# Patient Record
Sex: Female | Born: 1940 | Race: White | Hispanic: No | Smoking: Never smoker
Health system: Southern US, Community
[De-identification: ages and names within clinical notes are randomized; demographics above are authoritative.]

## PROBLEM LIST (undated history)

## (undated) DIAGNOSIS — K7469 Other cirrhosis of liver: Principal | ICD-10-CM

## (undated) DIAGNOSIS — Z8711 Personal history of peptic ulcer disease: Secondary | ICD-10-CM

## (undated) DIAGNOSIS — G459 Transient cerebral ischemic attack, unspecified: Secondary | ICD-10-CM

## (undated) DIAGNOSIS — K769 Liver disease, unspecified: Secondary | ICD-10-CM

## (undated) DIAGNOSIS — I10 Essential (primary) hypertension: Secondary | ICD-10-CM

## (undated) DIAGNOSIS — H8109 Meniere's disease, unspecified ear: Secondary | ICD-10-CM

## (undated) DIAGNOSIS — E785 Hyperlipidemia, unspecified: Secondary | ICD-10-CM

## (undated) DIAGNOSIS — M1990 Unspecified osteoarthritis, unspecified site: Secondary | ICD-10-CM

## (undated) DIAGNOSIS — K279 Peptic ulcer, site unspecified, unspecified as acute or chronic, without hemorrhage or perforation: Secondary | ICD-10-CM

## (undated) DIAGNOSIS — R42 Dizziness and giddiness: Secondary | ICD-10-CM

## (undated) DIAGNOSIS — K2270 Barrett's esophagus without dysplasia: Secondary | ICD-10-CM

## (undated) DIAGNOSIS — N183 Chronic kidney disease, stage 3 unspecified: Secondary | ICD-10-CM

## (undated) DIAGNOSIS — F32A Depression, unspecified: Secondary | ICD-10-CM

## (undated) DIAGNOSIS — F419 Anxiety disorder, unspecified: Secondary | ICD-10-CM

## (undated) DIAGNOSIS — Z8781 Personal history of (healed) traumatic fracture: Secondary | ICD-10-CM

## (undated) DIAGNOSIS — R269 Unspecified abnormalities of gait and mobility: Secondary | ICD-10-CM

## (undated) DIAGNOSIS — G4700 Insomnia, unspecified: Secondary | ICD-10-CM

## (undated) DIAGNOSIS — G250 Essential tremor: Secondary | ICD-10-CM

## (undated) DIAGNOSIS — K219 Gastro-esophageal reflux disease without esophagitis: Secondary | ICD-10-CM

## (undated) DIAGNOSIS — I700 Atherosclerosis of aorta: Secondary | ICD-10-CM

## (undated) HISTORY — DX: Dizziness and giddiness: R42

## (undated) HISTORY — PX: CHOLECYSTECTOMY: SHX55

## (undated) HISTORY — DX: Peptic ulcer, site unspecified, unspecified as acute or chronic, without hemorrhage or perforation: K27.9

## (undated) HISTORY — DX: Unspecified abnormalities of gait and mobility: R26.9

## (undated) HISTORY — DX: Transient cerebral ischemic attack, unspecified: G45.9

## (undated) HISTORY — DX: Unspecified osteoarthritis, unspecified site: M19.90

## (undated) HISTORY — DX: Hyperlipidemia, unspecified: E78.5

## (undated) HISTORY — DX: Liver disease, unspecified: K76.9

## (undated) HISTORY — PX: CATARACT EXTRACTION: SUR2

## (undated) HISTORY — DX: Barrett's esophagus without dysplasia: K22.70

## (undated) HISTORY — DX: Depression, unspecified: F32.A

## (undated) HISTORY — DX: Chronic kidney disease, stage 3 unspecified: N18.30

## (undated) HISTORY — DX: Essential tremor: G25.0

## (undated) HISTORY — DX: Personal history of (healed) traumatic fracture: Z87.81

## (undated) HISTORY — DX: Insomnia, unspecified: G47.00

## (undated) HISTORY — DX: Personal history of peptic ulcer disease: Z87.11

## (undated) HISTORY — PX: REPLACEMENT TOTAL KNEE BILATERAL: SUR1225

## (undated) HISTORY — DX: Essential (primary) hypertension: I10

## (undated) HISTORY — DX: Gastro-esophageal reflux disease without esophagitis: K21.9

## (undated) HISTORY — DX: Atherosclerosis of aorta: I70.0

## (undated) HISTORY — DX: Meniere's disease, unspecified ear: H81.09

## (undated) HISTORY — DX: Anxiety disorder, unspecified: F41.9

---

## 2003-10-27 DIAGNOSIS — K284 Chronic or unspecified gastrojejunal ulcer with hemorrhage: Secondary | ICD-10-CM

## 2003-10-27 HISTORY — DX: Chronic or unspecified gastrojejunal ulcer with hemorrhage: K28.4

## 2015-04-04 ENCOUNTER — Ambulatory Visit: Admit: 2015-04-04 | Discharge: 2015-04-04 | Payer: MEDICARE | Attending: Surgery | Primary: Internal Medicine

## 2015-04-04 DIAGNOSIS — G250 Essential tremor: Secondary | ICD-10-CM

## 2015-04-04 DIAGNOSIS — K828 Other specified diseases of gallbladder: Secondary | ICD-10-CM | POA: Insufficient documentation

## 2015-04-04 MED ORDER — DICYCLOMINE HCL 20 MG PO TABS
20 MG | ORAL_TABLET | Freq: Three times a day (TID) | ORAL | Status: AC | PRN
Start: 2015-04-04 — End: ?

## 2015-04-04 NOTE — Progress Notes (Signed)
Advanced Laparoscopy  New Patient Intake  Date: 04/04/15  Patient Name: Cindy Bernard    HPI:  Cindy Bernard is a 74 y.o. 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   First occurrence was 1 month ago.  She has had another attack of pain since after eating a buttered english muffin.  She underwent US by Dr. Rock Nephew her PCP showing sludge with normal CBD, no evidence of pericholecystic fluid or ascites.  She then underwent a HIDA scan showing and EF of 11%.  She did not have symptoms elicited with the HIDA scan.  She states she does drink daily and does follow with Dr. Rock Nephew every three months for her liver and has regular laboratory testing.  She denies kidney problems, bleeding, yellowing of the skin or eyes, abdominal bloating and pain other than the new RUQ pain.  She has had a colonoscopy in the last 5 years.  She denies changes in her bowel habits.  She also states she recently had UGI bleeding she was told was secondary to a stomach ulcer and she had to undergo and interventional procedure and EGD to stop the bleeding.  She did also ask about liver biopsy for further workup of there liver disease.     PMHx:   Past Medical History   Diagnosis Date   ??? Bronchitis    ??? Hypertension    ??? Osteoarthritis    ??? Peptic ulcer disease    ??? Cirrhosis with alcoholism [facility])        PSHx:   Past Surgical History   Procedure Laterality Date   ??? Knee surgery  2005     [place]   ??? Colonoscopy     ??? Hysterectomy     ??? Upper gastrointestinal endoscopy         PFMHx: History reviewed. No pertinent family history.    ALL: No Known Allergies    MEDS:   Current Outpatient Prescriptions   Medication Sig Dispense Refill   ??? lisinopril (PRINIVIL;ZESTRIL) 40 MG tablet Take 40 mg by mouth daily     ??? omeprazole (PRILOSEC) 20 MG capsule Take 20 mg by mouth daily     ??? ALPRAZolam (XANAX) 0.25 MG  tablet Take 0.25 mg by mouth nightly as needed for Sleep     ??? amLODIPine (NORVASC) 5 MG tablet Take 5 mg by mouth daily     ??? atorvastatin (LIPITOR) 40 MG tablet Take 40 mg by mouth daily     ??? dicyclomine (BENTYL) 20 MG tablet Take 1 tablet by mouth 3 times daily as needed 60 tablet 1     No current facility-administered medications for this visit.       SOCIAL Hx:   History     Social History   ??? Marital Status: Widowed     Spouse Name: N/A     Number of Children: N/A   ??? Years of Education: N/A     Occupational History   ??? Not on file.     Social History Main Topics   ??? Smoking status: Never Smoker    ??? Smokeless tobacco: Not on file   ??? Alcohol Use: Yes   ??? Drug Use: No   ??? Sexual Activity: Not on file     Other Topics Concern   ??? Not on file     Social  History Narrative   ??? No narrative on file       ROS: She denies fever/chills, headache, lightheadedness, seizures, visual changes, chest pain, cough, shortness of breath, hemoptysis, hematemesis, hematochezia, dysphagia, nausea, vomiting, diarrhea, constipation, weight loss, fatigue, easy bruising, skin rashes, numbness, tingling, weakness and denies history of MI, CVA, CHF, arrhythmia, lung disease requiring inhaler use of hospitalization, hepatitis, liver disease, bleeding or clotting disorders, kidney disease/stones/failure, neurologic disorders or seizures.      DIAGNOSTIC EVALUATION: US/HIDA scan as above.     Physical Examination: BP 126/76 mmHg   Pulse 81   Temp(Src) 98.5 ??F (36.9 ??C)   Ht '4\' 11"'  (1.499 m)   Wt 203 lb 6.4 oz (92.262 kg)   BMI 41.06 kg/m2   She stands Height: '4\' 11"'  (149.9 cm) tall with a weight of Weight: 203 lb 6.4 oz (92.262 kg) , resulting in a BMI of Body mass index is 41.06 kg/(m^2).Marland Kitchen    General:  The patient is awake, alert, and oriented, and is in no apparent distress.      Head and Neck:  Normocephalic and atraumatic. No obvious carotid bruits.    Cardiac:  Regular rate and rhythm without evidence of murmur.    Respiratory:    Clear to auscultation bilaterally.  No wheezes noted.    Abdomen:   Soft, discomfort to palpation of the RUQ, non-distended, normal bowel sounds, no masses or organomegaly    Extremities:   Ambulatory without assistance.  Normal pulses.    Neurological:  Intact x 4 extremities, no focal deficits notes.    Skin:    No rashes or lesions noted.      IMPRESSION/PLAN:     RUQ pain  Nausea  Cirrhosis with alcoholism  Gallbladder sludge  Biliary dyskinesia    She will need medical risk stratification from his primary care physician. We will proceed with surgical intervention pending workup of there liver disease.     It was discussed with Ms. Pallo and her daughter that US imaging of portal flow to the liver and LFTs and BMP laboratory values would be needed for further evaluation of her liver disease to determine surgical risk prior to surgery.  Dr. Rock Nephew office will be contacted to see if these laboratories have been done recently, if not they will be ordered.  The [place] test will be scheduled for her.  If these tests conclude her liver disease stage is acceptable risk for elective surgery, we will proceed with laparoscopic possible open cholecystectomy and liver biopsy.  If these tests conclude she is high risk for elective surgery, we will defer elective cholecystectomy.  The procedure was discussed at length with the patient and her daughter as described below.      If we can get the labs from Dr. Rock Nephew, we will reorder a hepatic panel, creatinine, PT/INR.    I met with the patient today to discuss risks and benefits of laparoscopic Cholecystectomy and liver biopsy, including, but not limited to injury to the cystic duct or common bile duct, conversion to open, the need for reoperative or endoscopic therapy, prolonged mechanical ventilation, and death. We discussed potential for postcholecystectomy chronic diarrhea, the potential for hemorrhage, infection, incomplete resolution of Her symptoms, as well as cardiac and  pulmonary-related complications. The patient understands and wishes to proceed.     Greater than 50% of the face to face encounter was spent discussing/counseling the patient regarding the risks and benefits of surgery as well as the preoperative and  postoperative care plan for this patient. The patient was seen and examined independently and relevant data reviewed by myself. A full chart review was performed.    [place] was seen today for abdominal pain.    Diagnoses and associated orders for this visit:    Benign essential tremor  - 93975 - PR Duplex Abd/Pel Vasc Study, Complete    Alcoholic cirrhosis of liver without ascites ([place])  - 90240 - PR Duplex Abd/Pel Vasc Study, Complete    RUQ abdominal pain  - 93975 - PR Duplex Abd/Pel Vasc Study, Complete    Alcoholic liver disease (HCC)    RUQ pain    Gallbladder sludge    Biliary dyskinesia    Other Orders  - dicyclomine (BENTYL) 20 MG tablet; Take 1 tablet by mouth 3 times daily as needed        Patient Care Team:  Elvina Mattes, MD as PCP - Finley, MD as Consulting Physician (Neurology)      Electronically signed by Velia Meyer, MD on 04/04/2015 at 5:01 PM

## 2015-04-05 NOTE — Addendum Note (Signed)
Addended by: Margette Fast on: 04/05/2015 04:33 PM     Modules accepted: Orders

## 2015-04-11 ENCOUNTER — Encounter

## 2015-04-11 LAB — PROTIME-INR
INR: 1.1 NA (ref 0.9–1.1)
Protime: 11.8 s (ref 9.0–12.0)

## 2015-04-18 NOTE — Op Note (Signed)
PATIENT:             Cindy Bernard, Cindy Bernard      SURGERY DATE:         04/17/2015  MEDICAL RECORD NUMBER:     [id number]            ADMISSION DATE:       04/17/2015  ACCOUNT #:           [id number]           ADMITTING:            Jackalyn Lombard, MD  DATE OF BIRTH:       [birth year]             SURGEON:              Jackalyn Lombard, MD  AGE:                 74                     HOSPITAL SERVICE:                                  Electronically Authenticated                             Jackalyn Lombard, MD 04/22/2015 09:27 A      PROCEDURE:  1. LAPAROSCOPIC CHOLECYSTECTOMY.  2. LAPAROSCOPIC LIVER BIOPSY.    POSTOPERATIVE DIAGNOSES:  1. Symptomatic cholelithiasis with an abnormal HIDA scan and biliary        dyskinesia.  2. Alcoholic liver disease.    POSTOPERATIVE DIAGNOSES:  1. Symptomatic cholelithiasis with an abnormal HIDA scan and biliary        dyskinesia.  2. Alcoholic liver disease  3. Cirrhotic nodular liver.    ANESTHESIA:  General.    assistant:  Rosita Fire. Nelda Severe, D.O.    Complications:  None.    Estimated Blood Loss:  Minimal.    Urine Output:  Not recorded.    Fluids:  400 mL of crystalloid.    Preoperative Medications:  Ancef    Indications for Procedure:  The patient is a 73 year old female with  right upper quadrant pain, as well as alcoholic liver disease.  She is  undergoing cholecystectomy with concurrent liver biopsy.    Description of Procedure:  The patient was brought to the operating  room and placed in the supine position.  After initiation of general  anesthesia by the Anesthesia Department, an orogastric tube was placed  to decompress the stomach.  The abdomen was prepped and draped in a  normal sterile fashion.  A Veress needle was placed in the left upper  quadrant.  We insufflated without difficulty.  We placed three 5 mm  trocars and a 12 mm trocar.    Upon entering the abdomen, a nodular-appearing liver was noted  consistent with her alcoholic liver disease.  The gallbladder  was  identified, grasped, and retracted cephalad.  The peritoneum overlying  the junction of the cystic duct was stripped free.  Hook  electrocautery was used to detach the lateral peritoneal attachments  to the left and right side of the gallbladder up towards the fundus.  We isolated the cystic duct and the cystic artery and dissected the  posterior aspect of the gallbladder from the gallbladder fossa.  There  were only 2 structures noted, the cystic duct and the cystic artery.  We placed 3 clips on the stay side of the cystic duct and 1 clip on  the gallbladder side.  The cystic duct was divided.  We placed 2 clips  on the stay side of the cystic artery, 1 clip on the gallbladder side.  The cystic artery was divided.  Hook electrocautery was used to remove  the gallbladder from the gallbladder fossa.  Prior to amputating the  gallbladder, we again re-evaluated our clip structures.  There was no  evidence of bleeding or bilious extravasation.  The gallbladder was  amputated and removed using a wound protection device.  The right  upper quadrant was copiously irrigated and the effluent aspirated.  Multiple sutures of 0 Vicryl used to close the fascial defect at the  navel.  The pneumoperitoneum was evacuated.  The fascial ties were  secured.  The skin was closed using 4-0 Vicryl.    Because of her abnormal appearance of her liver, as well as abnormal  visual inspection of the liver and abnormal preoperative abdominal  ultrasound showing nodular cirrhotic-appearing liver, a liver biopsy  was performed.  Multiple bites at the right lobe of the liver were  obtained using laparoscopic liver biopsy forceps.  The tissue was sent  to pathology for pathologic interpretation.  Hemostasis was obtained  using Bovie electrocautery, without difficulty.    There were no qualified surgical residents available to facilitate the  procedure.  As a result, Dr. Mathis Bud was asked to serve as  the first assistant for this  complicated laparoscopic procedure.    Diskriter Job ID: [id number]        Jackalyn Lombard, MD    DOD:04/17/2015 04:00 P  JZ/dsk  DOT:04/18/2015 03:02 A  Job Number: [id number]  Document Number: [id number]  cc:   Griffith Citron, DO        [address]        [place] [address]          Jackalyn Lombard, MD        [facility]        [address]        [place] [address]

## 2015-04-19 LAB — SURGICAL PATHOLOGY

## 2015-05-03 ENCOUNTER — Ambulatory Visit: Admit: 2015-05-03 | Discharge: 2015-05-03 | Payer: MEDICARE | Attending: Surgery | Primary: Internal Medicine

## 2015-05-03 DIAGNOSIS — F1020 Alcohol dependence, uncomplicated: Secondary | ICD-10-CM

## 2015-05-03 NOTE — Progress Notes (Signed)
Advanced Lapar[facility]  [facility]  Patient Name: Cindy Bernard  Date: 05/03/15      S: Cindy Bernard follows up for a post operative visit after undergoing a laparoscopic cholecystectomy and liver wedge biopsy on 04/22/2015.  She is doing well without any major postoperative complications.  Her pain control is well controlled and she is not asking for narcotic refills.  Her bowel function has returned to normal without constipation or diarrhea.  Her appetite is returning to normal and she does not report any dysphagia, nausea or vomiting.    No Known Allergies    Current Outpatient Prescriptions   Medication Sig Dispense Refill   ??? lisinopril (PRINIVIL;ZESTRIL) 40 MG tablet Take 40 mg by mouth daily     ??? omeprazole (PRILOSEC) 20 MG capsule Take 20 mg by mouth daily     ??? ALPRAZolam (XANAX) 0.25 MG tablet Take 0.25 mg by mouth nightly as needed for Sleep     ??? amLODIPine (NORVASC) 5 MG tablet Take 5 mg by mouth daily     ??? atorvastatin (LIPITOR) 40 MG tablet Take 40 mg by mouth daily     ??? dicyclomine (BENTYL) 20 MG tablet Take 1 tablet by mouth 3 times daily as needed 60 tablet 1     No current facility-administered medications for this visit.        Past Medical History   Diagnosis Date   ??? Bronchitis    ??? Cirrhosis with alcoholism (HCC)    ??? Hypertension    ??? Osteoarthritis    ??? Peptic ulcer disease        O:   Visit Vitals   ??? BP 140/86 (Site: Right Arm)   ??? Temp 98.1 ??F (36.7 ??C)   ??? Ht 4' 11.06" (1.5 m)   ??? Wt 200 lb 3.2 oz (90.8 kg)   ??? BMI 40.36 kg/m2        Physical Exam: The wounds are healing well. There is no evidence of infection, seroma, erythema or hernia.     Pathology:     DIAGNOSIS:    A. GALLBLADDER, CHOLECYSTECTOMY - CHRONIC CHOLECYSTITIS.    B. 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    COMMENT: ??The wedge biopsy demonstrates several fragments of hepatic  parenchyma wherein the normal  hepatic architecture is distorted by  intersecting bands of fibrosis with concomitant nodule formation. ??A  trichrome stain confirms nodule formation. While the findings may be  artifactual due to the subcapsular nature of the biopsy, cirrhosis is  suspected. ??In addition, areas of perisinusoidal fibrosis are noted.  There is approximately 10% macrovesicular steatosis. ??There is no  significant cholestasis or lobulitis. ??An iron stain shows no abnormal  iron deposition.    In summary, the findings are worrisome for cirrhosis, and although an  etiology is not entirely apparent, the findings do raise the  possibility of end-stage steatohepatitis. Further clinical correlation  is suggested.      A: Cindy Bernard was seen today for post-op check.    Diagnoses and all orders for this visit:    Alcoholism (HCC)    Alcoholic liver disease (HCC)    Gallbladder sludge    RUQ pain    Encounter for postoperative care        P: She may advance diet as tolerated.  She may increase their activity to a normal level yet refrain from lifting greater than 15# for  one month after surgery.  Follow-up p.r.n.  I referred her back to her gastroenterologist, Dr. Milderd Meager for hepatic follow-up and recommendations for her liver disease.  At this point I am very proud of Cindy Bernard, she has not been drinking since her initial office visit and she states that she has no plans to restart drinking.  This certainly will help her overall health.  Her daughter was there and is also very bright of her for not drinking at this point.    The patient was seen and examined independently and relevant data reviewed by myself. A full chart review was performed.    Electronically signed by Charlyn Minerva, MD on 05/03/2015 at 5:32 PM    Patient Care Team:  Griffith Citron, MD as PCP - General  Margorie Zayyan Mullen, MD as Consulting Physician (Neurology)  Ralene Ok, MD as Physician (Gastroenterology)

## 2015-11-18 DIAGNOSIS — H26499 Other secondary cataract, unspecified eye: Secondary | ICD-10-CM | POA: Insufficient documentation

## 2015-11-18 DIAGNOSIS — H35363 Drusen (degenerative) of macula, bilateral: Secondary | ICD-10-CM | POA: Insufficient documentation

## 2015-11-18 DIAGNOSIS — H43819 Vitreous degeneration, unspecified eye: Secondary | ICD-10-CM | POA: Insufficient documentation

## 2016-08-27 ENCOUNTER — Encounter: Attending: Internal Medicine | Primary: Internal Medicine

## 2017-06-21 ENCOUNTER — Encounter: Attending: Internal Medicine | Primary: Internal Medicine

## 2018-01-07 DIAGNOSIS — H04123 Dry eye syndrome of bilateral lacrimal glands: Secondary | ICD-10-CM | POA: Insufficient documentation

## 2018-08-01 DIAGNOSIS — H40053 Ocular hypertension, bilateral: Secondary | ICD-10-CM | POA: Insufficient documentation

## 2018-09-06 ENCOUNTER — Encounter: Primary: Internal Medicine

## 2018-10-10 ENCOUNTER — Ambulatory Visit: Admit: 2018-10-10 | Discharge: 2018-10-10 | Payer: MEDICARE | Attending: Family | Primary: Internal Medicine

## 2018-10-10 DIAGNOSIS — J069 Acute upper respiratory infection, unspecified: Secondary | ICD-10-CM

## 2018-10-10 MED ORDER — DOXYCYCLINE HYCLATE 100 MG PO TABS
100 MG | ORAL_TABLET | Freq: Two times a day (BID) | ORAL | 0 refills | Status: AC
Start: 2018-10-10 — End: 2018-10-20

## 2018-10-10 MED ORDER — BENZONATATE 200 MG PO CAPS
200 MG | ORAL_CAPSULE | Freq: Three times a day (TID) | ORAL | 0 refills | Status: AC | PRN
Start: 2018-10-10 — End: 2018-10-17

## 2018-10-10 NOTE — Patient Instructions (Signed)
Patient Education        Upper Respiratory Infection (Cold): Care Instructions  Your Care Instructions    An upper respiratory infection, or URI, is an infection of the nose, sinuses, or throat. URIs are spread by coughs, sneezes, and direct contact. The common cold is the most frequent kind of URI. The flu and sinus infections are other kinds of URIs.  Almost all URIs are caused by viruses. Antibiotics won't cure them. But you can treat most infections with home care. This may include drinking lots of fluids and taking over-the-counter pain medicine. You will probably feel better in 4 to 10 days.  The doctor has checked you carefully, but problems can develop later. If you notice any problems or new symptoms, get medical treatment right away.  Follow-up care is a key part of your treatment and safety. Be sure to make and go to all appointments, and call your doctor if you are having problems. It's also a good idea to know your test results and keep a list of the medicines you take.  How can you care for yourself at home?  ?? To prevent dehydration, drink plenty of fluids, enough so that your urine is light yellow or clear like water. Choose water and other caffeine-free clear liquids until you feel better. If you have kidney, heart, or liver disease and have to limit fluids, talk with your doctor before you increase the amount of fluids you drink.  ?? Take an over-the-counter pain medicine, such as acetaminophen (Tylenol), ibuprofen (Advil, Motrin), or naproxen (Aleve). Read and follow all instructions on the label.  ?? Before you use cough and cold medicines, check the label. These medicines may not be safe for young children or for people with certain health problems.  ?? Be careful when taking over-the-counter cold or flu medicines and Tylenol at the same time. Many of these medicines have acetaminophen, which is Tylenol. Read the labels to make sure that you are not taking more than the recommended dose. Too much  acetaminophen (Tylenol) can be harmful.  ?? Get plenty of rest.  ?? Do not smoke or allow others to smoke around you. If you need help quitting, talk to your doctor about stop-smoking programs and medicines. These can increase your chances of quitting for good.  When should you call for help?  Call 911 anytime you think you may need emergency care. For example, call if:  ?? ?? You have severe trouble breathing.   ??Call your doctor now or seek immediate medical care if:  ?? ?? You seem to be getting much sicker.   ?? ?? You have new or worse trouble breathing.   ?? ?? You have a new or higher fever.   ?? ?? You have a new rash.   ??Watch closely for changes in your health, and be sure to contact your doctor if:  ?? ?? You have a new symptom, such as a sore throat, an earache, or sinus pain.   ?? ?? You cough more deeply or more often, especially if you notice more mucus or a change in the color of your mucus.   ?? ?? You do not get better as expected.   Where can you learn more?  Go to [email or web address] and sign in to your MyChart account. Enter K520 in the Search Health Information box to learn more about "Upper Respiratory Infection (Cold): Care Instructions."     If you do not have an account, please click on the "  Sign Up Now" link.  Current as of: June 30, 2017  Content Version: 12.1  ?? 2006-2019 [employer]. Care instructions adapted under license by [facility]. If you have questions about a medical condition or this instruction, always ask your healthcare professional. [employer] disclaims any warranty or liability for your use of this information.         Patient Education        Cough: Care Instructions  Your Care Instructions    A cough is your body's response to something that bothers your throat or airways. Many things can cause a cough. You might cough because of a cold or the flu, bronchitis, or asthma. Smoking, postnasal drip, allergies, and stomach acid that backs up  into your throat also can cause coughs.  A cough is a symptom, not a disease. Most coughs stop when the cause, such as a cold, goes away. You can take a few steps at home to cough less and feel better.  Follow-up care is a key part of your treatment and safety. Be sure to make and go to all appointments, and call your doctor if you are having problems. It's also a good idea to know your test results and keep a list of the medicines you take.  How can you care for yourself at home?  ?? Drink lots of water and other fluids. This helps thin the mucus and soothes a dry or sore throat. Honey or lemon juice in hot water or tea may ease a dry cough.  ?? Take cough medicine as directed by your doctor.  ?? Prop up your head on pillows to help you breathe and ease a dry cough.  ?? Try cough drops to soothe a dry or sore throat. Cough drops don't stop a cough. Medicine-flavored cough drops are no better than candy-flavored drops or hard candy.  ?? Do not smoke. Avoid secondhand smoke. If you need help quitting, talk to your doctor about stop-smoking programs and medicines. These can increase your chances of quitting for good.  When should you call for help?  Call 911 anytime you think you may need emergency care. For example, call if:  ?? ?? You have severe trouble breathing.   ??Call your doctor now or seek immediate medical care if:  ?? ?? You cough up blood.   ?? ?? You have new or worse trouble breathing.   ?? ?? You have a new or higher fever.   ?? ?? You have a new rash.   ??Watch closely for changes in your health, and be sure to contact your doctor if:  ?? ?? You cough more deeply or more often, especially if you notice more mucus or a change in the color of your mucus.   ?? ?? You have new symptoms, such as a sore throat, an earache, or sinus pain.   ?? ?? You do not get better as expected.   Where can you learn more?  Go to [email or web address] and sign in to your MyChart account. Enter D279 in the Search Health  Information box to learn more about "Cough: Care Instructions."     If you do not have an account, please click on the "Sign Up Now" link.  Current as of: June 30, 2017  Content Version: 12.1  ?? 2006-2019 [employer]. Care instructions adapted under license by  Health. If you have questions about a medical condition or this instruction, always ask your healthcare professional. [employer] disclaims any warranty   or liability for your use of this information.

## 2018-10-10 NOTE — Progress Notes (Signed)
Subjective:     Patient: Cindy Bernard is a 77 y.o. female     URI    This is a new problem. The current episode started in the past 7 days. The problem has been unchanged. There has been no fever. Associated symptoms include congestion, coughing, headaches, rhinorrhea, sinus pain, sneezing and wheezing. Pertinent negatives include no ear pain, plugged ear sensation or sore throat. Associated symptoms comments: Chest congestion as well as cough that is productive of green/yellow sputum. . Treatments tried: dayquil as well as mucinex  The treatment provided mild relief.        Review of Systems   Constitutional: Positive for activity change. Negative for appetite change, chills, diaphoresis, fatigue and fever.   HENT: Positive for congestion, rhinorrhea, sinus pressure, sinus pain and sneezing. Negative for ear pain, postnasal drip and sore throat.    Respiratory: Positive for cough and wheezing.    Musculoskeletal: Negative for myalgias.   Allergic/Immunologic: Negative for environmental allergies, food allergies and immunocompromised state.   Neurological: Positive for headaches.   Psychiatric/Behavioral: Negative for agitation and confusion.        No Known Allergies  Current Outpatient Medications on File Prior to Visit   Medication Sig Dispense Refill   ??? lisinopril (PRINIVIL;ZESTRIL) 40 MG tablet Take 40 mg by mouth daily     ??? omeprazole (PRILOSEC) 20 MG capsule Take 20 mg by mouth daily     ??? ALPRAZolam (XANAX) 0.25 MG tablet Take 0.25 mg by mouth nightly as needed for Sleep     ??? amLODIPine (NORVASC) 5 MG tablet Take 5 mg by mouth daily     ??? atorvastatin (LIPITOR) 40 MG tablet Take 40 mg by mouth daily     ??? dicyclomine (BENTYL) 20 MG tablet Take 1 tablet by mouth 3 times daily as needed (Patient not taking: Reported on 10/10/2018) 60 tablet 1     No current facility-administered medications on file prior to visit.       Past Medical History:   Diagnosis Date   ??? Bronchitis    ??? Cirrhosis with alcoholism  (HCC)    ??? Hypertension    ??? Osteoarthritis    ??? Peptic ulcer disease       Social History     Tobacco Use   ??? Smoking status: Never Smoker   ??? Smokeless tobacco: Never Used   Substance Use Topics   ??? Alcohol use: Yes          Objective:     BP (!) 141/60    Pulse 91    Temp 98.3 ??F (36.8 ??C)    Ht 5' (1.524 m)    Wt 219 lb (99.3 kg)    SpO2 98%    BMI 42.77 kg/m??     Physical Exam  Vitals signs and nursing note reviewed.   Constitutional:       General: She is not in acute distress.     Appearance: Normal appearance. She is ill-appearing. She is not toxic-appearing or diaphoretic.   HENT:      Head: Normocephalic and atraumatic.      Right Ear: Ear canal and external ear normal. There is no impacted cerumen.      Left Ear: Ear canal and external ear normal. There is no impacted cerumen.      Nose: Congestion and rhinorrhea present.      Mouth/Throat:      Mouth: Mucous membranes are moist.  Pharynx: Oropharynx is clear. No oropharyngeal exudate or posterior oropharyngeal erythema.   Cardiovascular:      Rate and Rhythm: Normal rate and regular rhythm.      Heart sounds: Normal heart sounds.   Pulmonary:      Effort: Pulmonary effort is normal. No tachypnea, bradypnea, accessory muscle usage, prolonged expiration or respiratory distress.      Breath sounds: No stridor or decreased air movement. Examination of the right-upper field reveals rhonchi. Examination of the left-upper field reveals rhonchi. Rhonchi present. No decreased breath sounds, wheezing or rales.      Comments: rhonci in upper fields that resolves with cough.   Chest:      Chest wall: No tenderness.   Skin:     General: Skin is warm and dry.   Neurological:      General: No focal deficit present.      Mental Status: She is alert and oriented to person, place, and time.   Psychiatric:         Mood and Affect: Mood normal.         Behavior: Behavior normal.         Thought Content: Thought content normal.         Judgment: Judgment normal.          Assessment      1. Upper respiratory tract infection, unspecified type    2. Cough         Plan      1. Upper respiratory tract infection, unspecified type  - doxycycline hyclate (VIBRA-TABS) 100 MG tablet; Take 1 tablet by mouth 2 times daily for 10 days  Dispense: 20 tablet; Refill: 0    2. Cough  - benzonatate (TESSALON) 200 MG capsule; Take 1 capsule by mouth 3 times daily as needed for Cough  Dispense: 20 capsule; Refill: 0    Plan of care for this patient is to treat for URI with cough. Patient will be given script for doxycycline as well as tessalon for cough. I also advised patient to continue use of mucinex as it was effective in thinning secretions.     Patient advised that symptoms are not likely pneumonia at this time as patient's vital signs were (WNL as charted above), they were non diaphoretic, non pale, able to speak in full clear sentences, no accessory muscle use, no rapid respirations, denies body aches/chills, no chest pain, denies changes in appetite, and lung sounds were otherwise unremarkable and clear to auscultation with rhonci in upper fields that resolves with cough. Antibiotic prescribed will offer coverage however.    Patient advised to drink fluids, get rest and take all meds as prescribed. Patient advised if symptoms worsen or persist, they are to follow up with PCP. Patient agreeable with treatment plan.     Roswell Miners, APRN - CNP  10/10/18  11:32 AM        If symptoms do not improve, worsen, or new symptoms develop, see PCP for further evaluation.

## 2018-12-08 ENCOUNTER — Ambulatory Visit: Primary: Internal Medicine

## 2019-10-17 DIAGNOSIS — G250 Essential tremor: Secondary | ICD-10-CM | POA: Insufficient documentation

## 2019-10-17 DIAGNOSIS — R42 Dizziness and giddiness: Secondary | ICD-10-CM | POA: Insufficient documentation

## 2019-10-17 DIAGNOSIS — R930 Abnormal findings on diagnostic imaging of skull and head, not elsewhere classified: Secondary | ICD-10-CM | POA: Insufficient documentation

## 2019-10-17 DIAGNOSIS — R269 Unspecified abnormalities of gait and mobility: Secondary | ICD-10-CM | POA: Insufficient documentation

## 2020-11-08 LAB — COMPREHENSIVE METABOLIC PANEL
Albumin: 3.7 (ref 3.5–5.0)
Calcium: 9.4 (ref 8.7–10.7)
GFR calc Af Amer: 59
GFR calc non Af Amer: 51
Globulin: 3.8

## 2020-11-08 LAB — LIPID PANEL
Cholesterol: 155 (ref 0–200)
HDL: 62 (ref 35–70)
LDL Cholesterol: 71
Triglycerides: 132 (ref 40–160)

## 2020-11-08 LAB — HEPATIC FUNCTION PANEL
ALT: 17 (ref 7–35)
AST: 24 (ref 13–35)
Alkaline Phosphatase: 112 (ref 25–125)
Bilirubin, Total: 0.6

## 2020-11-08 LAB — BASIC METABOLIC PANEL
BUN: 14 (ref 4–21)
CO2: 32 — AB (ref 13–22)
Chloride: 98 — AB (ref 99–108)
Creatinine: 1 (ref 0.5–1.1)
Glucose: 125
Potassium: 4.5 (ref 3.4–5.3)
Sodium: 135 — AB (ref 137–147)

## 2020-11-08 LAB — HEMOGLOBIN A1C: Hemoglobin A1C: 5.9

## 2021-06-02 ENCOUNTER — Telehealth: Payer: Self-pay

## 2021-06-02 NOTE — Telephone Encounter (Signed)
That is okay, thank you. Please advise to bring her medical records and an accurate medication list.

## 2021-06-02 NOTE — Telephone Encounter (Signed)
Okay to schedule NP appt at her convenience (40 mins), needs to bring records w/ her and accurate med list (she may need to come by the office prior to her visit to sign an ROI if she doesn't have her records). Please schedule her out several weeks.

## 2021-06-02 NOTE — Telephone Encounter (Signed)
Please advise 

## 2021-06-02 NOTE — Telephone Encounter (Signed)
Patient is the daughter of Madeline Daniel, and would like to become Dr. Drue Novel patient. She is moving here from [place] to live with her daughter. Please advise

## 2021-06-04 NOTE — Telephone Encounter (Signed)
Spoke to pt's daughter who is helping her and scheduled the NP appointment. Daughter is aware she has to bring her mom's medical records and med list.

## 2021-07-01 ENCOUNTER — Ambulatory Visit (INDEPENDENT_AMBULATORY_CARE_PROVIDER_SITE_OTHER): Payer: Medicare Other | Admitting: Internal Medicine

## 2021-07-01 ENCOUNTER — Other Ambulatory Visit: Payer: Self-pay

## 2021-07-01 ENCOUNTER — Telehealth: Payer: Self-pay

## 2021-07-01 ENCOUNTER — Encounter: Payer: Self-pay | Admitting: Internal Medicine

## 2021-07-01 VITALS — BP 122/66 | HR 77 | Temp 97.9°F | Resp 18 | Ht 59.0 in | Wt 220.2 lb

## 2021-07-01 DIAGNOSIS — I10 Essential (primary) hypertension: Secondary | ICD-10-CM | POA: Diagnosis not present

## 2021-07-01 DIAGNOSIS — M79642 Pain in left hand: Secondary | ICD-10-CM

## 2021-07-01 DIAGNOSIS — M79641 Pain in right hand: Secondary | ICD-10-CM | POA: Diagnosis not present

## 2021-07-01 DIAGNOSIS — E785 Hyperlipidemia, unspecified: Secondary | ICD-10-CM

## 2021-07-01 DIAGNOSIS — R739 Hyperglycemia, unspecified: Secondary | ICD-10-CM

## 2021-07-01 DIAGNOSIS — Z09 Encounter for follow-up examination after completed treatment for conditions other than malignant neoplasm: Secondary | ICD-10-CM | POA: Insufficient documentation

## 2021-07-01 DIAGNOSIS — M8949 Other hypertrophic osteoarthropathy, multiple sites: Secondary | ICD-10-CM

## 2021-07-01 DIAGNOSIS — M79671 Pain in right foot: Secondary | ICD-10-CM

## 2021-07-01 DIAGNOSIS — R269 Unspecified abnormalities of gait and mobility: Secondary | ICD-10-CM

## 2021-07-01 DIAGNOSIS — M79672 Pain in left foot: Secondary | ICD-10-CM

## 2021-07-01 DIAGNOSIS — M159 Polyosteoarthritis, unspecified: Secondary | ICD-10-CM

## 2021-07-01 NOTE — Telephone Encounter (Signed)
Called previous PCP office in [place] (Dr. Onalee Hua Mallamaci) and left a message w/ medical records dept to check status of records. Informed that Pt's daughter [name] has also called several times to check on them. I've asked for call back at their earliest convenience.

## 2021-07-01 NOTE — Progress Notes (Signed)
Subjective:    Patient ID: Madeline Daniel, female    DOB: May 29, 1941, 80 y.o.   MRN: [id number]  DOS:  07/01/2021 Type of visit - description: New patient, here with her daughter  She is here to get established. We reviewed the available past medical history together. At this point her main concern is pain, mostly at her hands, started 6 months ago and is not letting up. She also have some pain and swelling at the distal L foot.   Review of Systems See above   Past Medical History:  Diagnosis Date   Arthritis    Essential tremor    Gait disorder    History of stomach ulcers    Hyperlipidemia    Hypertension    Meniere disease    TIA (transient ischemic attack)    Vertigo     Past Surgical History:  Procedure Laterality Date   CATARACT EXTRACTION Bilateral    CHOLECYSTECTOMY     REPLACEMENT TOTAL KNEE BILATERAL Bilateral    Social History   Socioeconomic History   Marital status: Widowed    Spouse name: Not on file   Number of children: 3   Years of education: Not on file   Highest education level: Not on file  Occupational History   Occupation: retired- Audiological scientist  Tobacco Use   Smoking status: Never   Smokeless tobacco: Never  Substance and Sexual Activity   Alcohol use: Yes    Comment: 2-3 glasses   Drug use: Not on file   Sexual activity: Not on file  Other Topics Concern   Not on file  Social History Narrative   Moved to live part  time in [place] 2022   Lives w/ daughter [name]   Social Determinants of Health   Financial Resource Strain: Not on file  Food Insecurity: Not on file  Transportation Needs: Not on file  Physical Activity: Not on file  Stress: Not on file  Social Connections: Not on file  Intimate Partner Violence: Not on file   Family History  Problem Relation Age of Onset   CAD Father        lives till 50 y/o   Colon cancer Brother    Breast cancer Neg Hx     Allergies as of 07/01/2021   No Known Allergies      Medication  List        Accurate as of July 01, 2021 11:59 PM. If you have any questions, ask your nurse or doctor.          albuterol 108 (90 Base) MCG/ACT inhaler Commonly known as: VENTOLIN HFA Inhale 1-2 puffs into the lungs every 6 (six) hours as needed for wheezing or shortness of breath.   ALPRAZolam 0.25 MG tablet Commonly known as: XANAX Take 0.25-0.5 mg by mouth daily as needed for anxiety.   amLODipine 5 MG tablet Commonly known as: NORVASC Take 5 mg by mouth daily.   atorvastatin 40 MG tablet Commonly known as: LIPITOR Take 40 mg by mouth daily.   DULoxetine 60 MG capsule Commonly known as: CYMBALTA Take 60 mg by mouth daily.   folic acid 0.5 MG tablet Commonly known as: FOLVITE Take 1 mg by mouth daily.   hydrochlorothiazide 12.5 MG tablet Commonly known as: HYDRODIURIL Take 12.5 mg by mouth daily.   lisinopril 40 MG tablet Commonly known as: ZESTRIL Take 40 mg by mouth daily.   metoprolol succinate 25 MG 24 hr tablet Commonly known as: TOPROL-XL Take  25 mg by mouth daily.   omeprazole 20 MG capsule Commonly known as: PRILOSEC Take 20 mg by mouth 2 (two) times daily before a meal.   ondansetron 4 MG tablet Commonly known as: ZOFRAN Take 4 mg by mouth every 8 (eight) hours as needed for nausea or vomiting.   potassium chloride 10 MEQ tablet Commonly known as: KLOR-CON Take 10 mEq by mouth daily.   primidone 50 MG tablet Commonly known as: MYSOLINE Take 100 mg by mouth at bedtime.   traMADol 50 MG tablet Commonly known as: ULTRAM Take 50 mg by mouth daily as needed.           Objective:   Physical Exam BP 122/66 (BP Location: Left Arm, Patient Position: Sitting, Cuff Size: Normal)   Pulse 77   Temp 97.9 F (36.6 C) (Oral)   Resp 18   Ht 4\' 11"  (1.499 m)   Wt 220 lb 4 oz (99.9 kg)   SpO2 95%   BMI 44.49 kg/m   General:   Well developed, NAD, BMI noted. HEENT:  Normocephalic . Face symmetric, atraumatic Neck: No thyromegaly.  She  reported tenderness to palpation at the left side of the anterior neck, area is free of a mass or any unusual lymph node. Lungs:  CTA B Normal respiratory effort, no intercostal retractions, no accessory muscle use. Heart: RRR,  no murmur.  Lower extremities: no pretibial edema bilaterally.  Minimal puffiness at the dorsum of both feet. Upper extremities: Changes consistent with DJD at the wrists and hands, no clear-cut synovitis Skin: Not pale. Not jaundice Neurologic:  alert & oriented X3.  Speech normal, gait slow, assisted by rolling walker.  Transfer is difficult. Psych--  Cognition and judgment appear intact.  Cooperative with normal attention span and concentration.  Behavior appropriate. No anxious or depressed appearing.      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 Hyperglycemia: Check A1c HTN: BP today satisfactory, on lisinopril, metoprolol, amlodipine, HCTZ.  Check a CMP, CBC. High cholesterol: Continue Lipitor Anxiety depression: Some stress, continue with Xanax and Cymbalta. Essential tremor: On primidone. DJD: Complain of pain at the knees, hands, neck, feet.  No synovitis on the upper extremities, request to get established with a local Ortho.  Will arrange. Gait disorder, frequent falls: This is not a new problem, patient reports she is related to dizziness, on exam I suspect DJD, obesity are also playing a role.  Uses a walker.  Has done PT before. TIA, history of: Good record Social: Move with  her daughter [place] from [name], plans to live in [place] during the winters Preventive care: Had 4 COVID vaccines, rec a flu shot this fall Will get records RTC 2 months  Time spent today: 35 minutes, going over her extensive past medical history, the patient also requested a referral due to DJD.  She explained  in great detail her gait disorder and frequent falls  This visit occurred during the SARS-CoV-2 public health emergency.  Safety protocols were in place, including screening questions prior to the  visit, additional usage of staff PPE, and extensive cleaning of exam room while observing appropriate contact time as indicated for disinfecting solutions.

## 2021-07-01 NOTE — Patient Instructions (Signed)
Check the  blood pressure once a week BP  GOAL is between 110/65 and  135/85. If it is consistently higher or lower, let me know  Get a flu shot this fall   GO TO THE LAB : Get the blood work     GO TO THE FRONT DESK, PLEASE SCHEDULE YOUR APPOINTMENTS Come back for   a checkup in 2 months

## 2021-07-02 ENCOUNTER — Encounter: Payer: Self-pay | Admitting: Internal Medicine

## 2021-07-02 DIAGNOSIS — M159 Polyosteoarthritis, unspecified: Secondary | ICD-10-CM | POA: Insufficient documentation

## 2021-07-02 DIAGNOSIS — M8949 Other hypertrophic osteoarthropathy, multiple sites: Secondary | ICD-10-CM | POA: Insufficient documentation

## 2021-07-02 LAB — HEMOGLOBIN A1C: Hgb A1c MFr Bld: 6.4 % (ref 4.6–6.5)

## 2021-07-02 LAB — CBC WITH DIFFERENTIAL/PLATELET
Basophils Absolute: 0.1 10*3/uL (ref 0.0–0.1)
Basophils Relative: 1.3 % (ref 0.0–3.0)
Eosinophils Absolute: 0.2 10*3/uL (ref 0.0–0.7)
Eosinophils Relative: 2 % (ref 0.0–5.0)
HCT: 40.5 % (ref 36.0–46.0)
Hemoglobin: 13.7 g/dL (ref 12.0–15.0)
Lymphocytes Relative: 24.3 % (ref 12.0–46.0)
Lymphs Abs: 2.1 10*3/uL (ref 0.7–4.0)
MCHC: 33.9 g/dL (ref 30.0–36.0)
MCV: 98.3 fl (ref 78.0–100.0)
Monocytes Absolute: 1 10*3/uL (ref 0.1–1.0)
Monocytes Relative: 11.2 % (ref 3.0–12.0)
Neutro Abs: 5.4 10*3/uL (ref 1.4–7.7)
Neutrophils Relative %: 61.2 % (ref 43.0–77.0)
Platelets: 282 10*3/uL (ref 150.0–400.0)
RBC: 4.12 Mil/uL (ref 3.87–5.11)
RDW: 12.4 % (ref 11.5–15.5)
WBC: 8.8 10*3/uL (ref 4.0–10.5)

## 2021-07-02 LAB — COMPREHENSIVE METABOLIC PANEL
ALT: 20 U/L (ref 0–35)
AST: 25 U/L (ref 0–37)
Albumin: 3.8 g/dL (ref 3.5–5.2)
Alkaline Phosphatase: 105 U/L (ref 39–117)
BUN: 13 mg/dL (ref 6–23)
CO2: 27 mEq/L (ref 19–32)
Calcium: 9.3 mg/dL (ref 8.4–10.5)
Chloride: 93 mEq/L — ABNORMAL LOW (ref 96–112)
Creatinine, Ser: 0.95 mg/dL (ref 0.40–1.20)
GFR: 55.87 mL/min — ABNORMAL LOW (ref >60.00)
Glucose, Bld: 110 mg/dL — ABNORMAL HIGH (ref 70–99)
Potassium: 4.5 mEq/L (ref 3.5–5.1)
Sodium: 129 mEq/L — ABNORMAL LOW (ref 135–145)
Total Bilirubin: 0.5 mg/dL (ref 0.2–1.2)
Total Protein: 7.5 g/dL (ref 6.0–8.3)

## 2021-07-02 LAB — TSH: TSH: 2.53 u[IU]/mL (ref 0.35–5.50)

## 2021-07-02 NOTE — Assessment & Plan Note (Signed)
New patient, to get established. Hyperglycemia: Check A1c HTN: BP today satisfactory, on lisinopril, metoprolol, amlodipine, HCTZ.  Check a CMP, CBC. High cholesterol: Continue Lipitor Anxiety depression: Some stress, continue with Xanax and Cymbalta. Essential tremor: On primidone. DJD: Complain of pain at the knees, hands, neck, feet.  No synovitis on the upper extremities, request to get established with a local Ortho.  Will arrange. Gait disorder, frequent falls: This is not a new problem, patient reports she is related to dizziness, on exam I suspect DJD, obesity are also playing a role.  Uses a walker.  Has done PT before. TIA, history of: Good record Social: Move with her daughter [name] from [place], plans to live in [place] during the winters Preventive care: Had 4 COVID vaccines, rec a flu shot this fall Will get records RTC 2 months

## 2021-07-10 NOTE — Telephone Encounter (Signed)
Called back and spoke w/ medical records- they have not received a ROI for this patient. I will reach out to Pt's family to come by and sign.

## 2021-07-11 ENCOUNTER — Encounter: Payer: Self-pay | Admitting: Internal Medicine

## 2021-07-18 NOTE — Telephone Encounter (Signed)
Signed form received. Faxed to Dr. Boris Lown at [phone]. ROI sent for scanning.

## 2021-08-07 ENCOUNTER — Encounter: Payer: Self-pay | Admitting: Internal Medicine

## 2021-08-07 DIAGNOSIS — F32A Depression, unspecified: Secondary | ICD-10-CM | POA: Insufficient documentation

## 2021-08-07 DIAGNOSIS — H8109 Meniere's disease, unspecified ear: Secondary | ICD-10-CM | POA: Insufficient documentation

## 2021-08-07 DIAGNOSIS — F419 Anxiety disorder, unspecified: Secondary | ICD-10-CM | POA: Insufficient documentation

## 2021-08-07 DIAGNOSIS — G894 Chronic pain syndrome: Secondary | ICD-10-CM

## 2021-08-07 DIAGNOSIS — E785 Hyperlipidemia, unspecified: Secondary | ICD-10-CM | POA: Insufficient documentation

## 2021-08-07 DIAGNOSIS — I700 Atherosclerosis of aorta: Secondary | ICD-10-CM | POA: Insufficient documentation

## 2021-08-07 DIAGNOSIS — K2270 Barrett's esophagus without dysplasia: Secondary | ICD-10-CM

## 2021-08-07 DIAGNOSIS — F322 Major depressive disorder, single episode, severe without psychotic features: Secondary | ICD-10-CM | POA: Insufficient documentation

## 2021-08-07 DIAGNOSIS — I10 Essential (primary) hypertension: Secondary | ICD-10-CM | POA: Insufficient documentation

## 2021-08-07 DIAGNOSIS — K219 Gastro-esophageal reflux disease without esophagitis: Secondary | ICD-10-CM | POA: Insufficient documentation

## 2021-08-07 DIAGNOSIS — K279 Peptic ulcer, site unspecified, unspecified as acute or chronic, without hemorrhage or perforation: Secondary | ICD-10-CM | POA: Insufficient documentation

## 2021-08-07 DIAGNOSIS — K769 Liver disease, unspecified: Secondary | ICD-10-CM | POA: Insufficient documentation

## 2021-08-07 DIAGNOSIS — N183 Chronic kidney disease, stage 3 unspecified: Secondary | ICD-10-CM | POA: Insufficient documentation

## 2021-08-07 NOTE — Telephone Encounter (Signed)
Records placed in PCP yellow folder for review.

## 2021-08-07 NOTE — Telephone Encounter (Signed)
Records received

## 2021-08-13 NOTE — Telephone Encounter (Addendum)
Review more than 100 pages.  Will abstract recent immunizations  Saw neurology for essential tremor April 2022. Other neurological diagnoses include abnormal gait, vertigo. They noted brain MRI revealed volume loss and small vessel ischemic disease. MRI C-spine multilevel cervical spondyloarthropathy.  Severe foraminal stenosis at some levels. Cord normal. Carotid less than 50% EMG bilateral lower extremities normal.  EKG 09/24/2020: Normal  History of Barrett's, see GI note 03/26/2020 ECG 04/02/2020: Normal duodenum, suggestion of Barrett's esophagus, hiatal hernia (dilatation), polyp stomach. Pathology not available  History of falls: ER evaluation 11/05/2019. CT showed age-indeterminate compression fracture at T12.  Question of liver cirrhosis with no overt findings consistent with that diagnosis. 1.5 cm left ovarian cyst  ER evaluation 04/16/2020, fall, rib fractures.  Will return some of the oldest records back to the patient for safekeeping

## 2021-08-19 NOTE — Telephone Encounter (Signed)
Records sent for scanning

## 2021-09-01 ENCOUNTER — Ambulatory Visit (INDEPENDENT_AMBULATORY_CARE_PROVIDER_SITE_OTHER): Payer: Medicare Other | Admitting: Internal Medicine

## 2021-09-01 ENCOUNTER — Encounter: Payer: Self-pay | Admitting: Internal Medicine

## 2021-09-01 ENCOUNTER — Other Ambulatory Visit: Payer: Self-pay

## 2021-09-01 VITALS — BP 144/72 | HR 101 | Temp 98.0°F | Resp 18 | Ht 59.0 in | Wt 214.5 lb

## 2021-09-01 DIAGNOSIS — K5903 Drug induced constipation: Secondary | ICD-10-CM | POA: Diagnosis not present

## 2021-09-01 DIAGNOSIS — Z23 Encounter for immunization: Secondary | ICD-10-CM

## 2021-09-01 DIAGNOSIS — K22719 Barrett's esophagus with dysplasia, unspecified: Secondary | ICD-10-CM

## 2021-09-01 DIAGNOSIS — G250 Essential tremor: Secondary | ICD-10-CM | POA: Diagnosis not present

## 2021-09-01 DIAGNOSIS — M810 Age-related osteoporosis without current pathological fracture: Secondary | ICD-10-CM

## 2021-09-01 DIAGNOSIS — F419 Anxiety disorder, unspecified: Secondary | ICD-10-CM

## 2021-09-01 DIAGNOSIS — K219 Gastro-esophageal reflux disease without esophagitis: Secondary | ICD-10-CM

## 2021-09-01 DIAGNOSIS — F32A Depression, unspecified: Secondary | ICD-10-CM

## 2021-09-01 DIAGNOSIS — R296 Repeated falls: Secondary | ICD-10-CM

## 2021-09-01 MED ORDER — ALPRAZOLAM 0.25 MG PO TABS: 0.2500 mg | ORAL_TABLET | Freq: Every day | ORAL | 0 refills | Status: DC | PRN

## 2021-09-01 MED ORDER — DULOXETINE HCL 30 MG PO CPEP: 30.0000 mg | ORAL_CAPSULE | Freq: Every day | ORAL | 1 refills | Status: DC

## 2021-09-01 NOTE — Progress Notes (Signed)
Subjective:    Patient ID: Madeline Daniel, female    DOB: 1941-09-04, 80 y.o.   MRN: [id number]  DOS:  09/01/2021 Type of visit - description: f/u, here with her daughter.  Multiple issues.  She has chronic constipation, worse in the last few days.  She had a very small BM 08/29/2021, "like little rocks". The next day she had several bowel movements, stools were hard, she was straining a lot.  At the end she noted red blood on top of the stools. She had generalized abdominal cramps. Denies rectal pain or itching. Appetite was somewhat decreased. She had some nausea but no vomiting. Since that large BM, overall pain has decreased. On further questioning, she has been taking Ultram frequently for the last week for neck pain.  No fever chills but admits to occasional cold sweats.  No weight loss or night sweats per se.  Also, anxiety and depression not well controlled, increase medication.    Review of Systems See above   Past Medical History:  Diagnosis Date   Anxiety    Arthritis    Atherosclerosis of aorta (HCC)    Barrett's esophagus    Bleeding ulcer 2005   CKD (chronic kidney disease), stage III (HCC)    Depression    Essential tremor    Gait disorder    GERD (gastroesophageal reflux disease)    History of rib fracture    multiple   History of stomach ulcers    Hyperlipidemia    Hypertension    Insomnia    Meniere disease    Nonalcoholic liver disease, chronic    Peptic ulcer disease    TIA (transient ischemic attack)    Vertigo     Past Surgical History:  Procedure Laterality Date   CATARACT EXTRACTION Bilateral    CHOLECYSTECTOMY     REPLACEMENT TOTAL KNEE BILATERAL Bilateral     Allergies as of 09/01/2021   No Known Allergies      Medication List        Accurate as of September 01, 2021 11:59 PM. If you have any questions, ask your nurse or doctor.          albuterol 108 (90 Base) MCG/ACT inhaler Commonly known as: VENTOLIN HFA Inhale 1-2  puffs into the lungs every 6 (six) hours as needed for wheezing or shortness of breath.   ALPRAZolam 0.25 MG tablet Commonly known as: XANAX Take 1-2 tablets (0.25-0.5 mg total) by mouth daily as needed for anxiety.   amLODipine 5 MG tablet Commonly known as: NORVASC Take 5 mg by mouth daily.   atorvastatin 40 MG tablet Commonly known as: LIPITOR Take 40 mg by mouth daily.   DULoxetine 60 MG capsule Commonly known as: CYMBALTA Take 60 mg by mouth daily. Take with 30mg  capsule to equal 90 mg daily What changed: Another medication with the same name was added. Make sure you understand how and when to take each. Changed by: , MD   DULoxetine 30 MG capsule Commonly known as: Cymbalta Take 1 capsule (30 mg total) by mouth daily. Take with 60mg  capsule to equal 90mg  daily What changed: You were already taking a medication with the same name, and this prescription was added. Make sure you understand how and when to take each. Changed by: Willow Ora, MD   folic acid 0.5 MG tablet Commonly known as: FOLVITE Take 1 mg by mouth daily.   hydrochlorothiazide 12.5 MG tablet Commonly known as: HYDRODIURIL Take 12.5 mg  by mouth daily.   lisinopril 40 MG tablet Commonly known as: ZESTRIL Take 40 mg by mouth daily.   metoprolol succinate 25 MG 24 hr tablet Commonly known as: TOPROL-XL Take 25 mg by mouth daily.   omeprazole 20 MG capsule Commonly known as: PRILOSEC Take 20 mg by mouth 2 (two) times daily before a meal.   ondansetron 4 MG tablet Commonly known as: ZOFRAN Take 4 mg by mouth every 8 (eight) hours as needed for nausea or vomiting.   potassium chloride 10 MEQ tablet Commonly known as: KLOR-CON Take 10 mEq by mouth daily.   primidone 50 MG tablet Commonly known as: MYSOLINE Take 100 mg by mouth at bedtime.   traMADol 50 MG tablet Commonly known as: ULTRAM Take 50 mg by mouth daily as needed.           Objective:   Physical Exam BP (!) 144/72 (BP  Location: Left Arm, Patient Position: Sitting, Cuff Size: Normal)   Pulse (!) 101   Temp 98 F (36.7 C) (Oral)   Resp 18   Ht 4\' 11"  (1.499 m)   Wt 214 lb 8 oz (97.3 kg)   SpO2 98%   BMI 43.32 kg/m  General:   Well developed, NAD, BMI noted.  HEENT:  Normocephalic . Face symmetric, atraumatic Lungs:  CTA B Normal respiratory effort, no intercostal retractions, no accessory muscle use. Heart: RRR,  no murmur.  Abdomen:  Not distended, soft, mildly tender throughout the abdomen, more so on the LLQ without mass or rebound. Skin: Not pale. Not jaundice Lower extremities: no pretibial edema bilaterally  Neurologic:  alert & oriented X3.  Speech normal, gait limited by BMI, age.  Needs help transferring. Psych--  Cognition and judgment appear intact.  Cooperative with normal attention span and concentration.  Behavior appropriate. No anxious or depressed appearing.     Assessment      Assessment (new patient, referred by her daughter ) Hyperglycemia HTN High cholesterol Anxiety depression GI: -GERD, h/o esophageal dilatations , Barrett's  (see GI note 03/26/2020) -EGD:  04/02/2020: Normal duodenum, suggestion of Barrett's esophagus, hiatal hernia (dilatation), polyp stomach. Pathology not available -Question of liver cirrhosis, incidental finding on CTA 11/05/2019. NEURO -Essential Tremor, onset age 34s  on primidone -Brain MRIvolume loss and small vessel ischemic disease. -H./o TIA  -Menire, lack of balance, zofran prn Bronchospasm  DJD knee-hands-neck , tramadol prn Gait d/o: due to dizziness >> DJD; uses a walker, h/o frequent fall Social: Move with her daughter 49s from [name], plans to live in [place] during the winters   PLAN: Since last office visit, multiple records were reviewed Constipation: Chronic issue, much worse in the last few days, see HPI.  Had a large BM 2 days ago associated with red blood per rectum and straining.  Had BM and is  passing gases today On today's exam, she is tender mostly on the left side, I asked about diverticulitis and she had episodes before but reports that this particular pain is completely different to diverticulitis. Plan: Minimize use of painkillers, see next.  MiraLAX daily.  Red flag symptoms that indicate the need to go to the ER discussed with the patient. 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  The last week she took Ultram nightly, more than her usual doses.  She also took a couple of hydrocodones.  At this point I recommend to minimize any  painkillers due to  constipation.  She agreed. Anxiety, depression: symptoms increased, request adjust meds: increase   Cymbalta from 60  to 90 mg.  Request Xanax RF: done.  Reassess on RTC. Tremors: Worse lately, asked for a referral, referred to Dr. Arbutus Leas. GERD, Barrett's esophagus: Had a EGD, will try to get the pathology reports T12 compression fractures: Found during ER evaluation on 11/05/2019 per old records review.  We will get a bone density test Preventive care: Flu shot today To have a new COVID booster soon. RTC 2 months   Time spent today 42 minutes. I addressed acute issue, will try to get records from her last EGD pathology, will also address preventive care, order a bone density test, provided listening therapy for anxiety and depression. Multiple questions answered for the patient and her daughter.  This visit occurred during the SARS-CoV-2 public health emergency.  Safety protocols were in place, including screening questions prior to the visit, additional usage of staff PPE, and extensive cleaning of exam room while observing appropriate contact time as indicated for disinfecting solutions.

## 2021-09-01 NOTE — Patient Instructions (Signed)
For constipation: Be sure you are always well-hydrated MiraLAX 17 g every day with fluids Minimize the use of pain medication such as tramadol, take perhaps 1 or 2 tablets a week at most If he has severe constipation, fever, nausea: Go to the ER    Increase Cymbalta to a total of 90 mg daily   GO TO THE FRONT DESK, PLEASE SCHEDULE YOUR APPOINTMENTS Come back for a checkup in 2 months   STOP BY THE FIRST FLOOR: Schedule your bone density test

## 2021-09-02 NOTE — Assessment & Plan Note (Signed)
Since last office visit, multiple records were reviewed Constipation: Chronic issue, much worse in the last few days, see HPI.  Had a large BM 2 days ago associated with red blood per rectum and straining.  Had BM and is passing gases today On today's exam, she is tender mostly on the left side, I asked about diverticulitis and she had episodes before but reports that this particular pain is completely different to diverticulitis. Plan: Minimize use of painkillers, see next.  MiraLAX daily.  Red flag symptoms that indicate the need to go to the ER discussed with the patient. 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  The last week she took Ultram nightly, more than her usual doses.  She also took a couple of hydrocodones.  At this point I recommend to minimize any  painkillers due to constipation.  She agreed. Anxiety, depression: symptoms increased, request adjust meds: increase   Cymbalta from 60  to 90 mg.  Request Xanax RF: done.  Reassess on RTC. Tremors: Worse lately, asked for a referral, referred to Dr. Arbutus Leas. GERD, Barrett's esophagus: Had a EGD, will try to get the pathology reports T12 compression fractures: Found during ER evaluation on 11/05/2019 per old records review.  We will get a bone density test Preventive care: Flu shot today To have a new COVID booster soon. RTC 2 months

## 2021-09-03 ENCOUNTER — Other Ambulatory Visit: Payer: Self-pay | Admitting: Internal Medicine

## 2021-09-03 ENCOUNTER — Encounter: Payer: Self-pay | Admitting: Internal Medicine

## 2021-09-03 MED ORDER — ONDANSETRON HCL 4 MG PO TABS: 4.0000 mg | ORAL_TABLET | Freq: Three times a day (TID) | ORAL | 0 refills | Status: DC | PRN

## 2021-09-08 ENCOUNTER — Ambulatory Visit: Payer: Medicare Other | Attending: Internal Medicine

## 2021-09-08 ENCOUNTER — Other Ambulatory Visit: Payer: Self-pay

## 2021-09-08 ENCOUNTER — Ambulatory Visit (HOSPITAL_BASED_OUTPATIENT_CLINIC_OR_DEPARTMENT_OTHER)
Admission: RE | Admit: 2021-09-08 | Discharge: 2021-09-08 | Disposition: A | Discharge status: Home / Self Care | Payer: Medicare Other | Source: Ambulatory Visit | Attending: Internal Medicine | Admitting: Internal Medicine

## 2021-09-08 DIAGNOSIS — M810 Age-related osteoporosis without current pathological fracture: Secondary | ICD-10-CM | POA: Insufficient documentation

## 2021-09-08 DIAGNOSIS — R296 Repeated falls: Secondary | ICD-10-CM | POA: Insufficient documentation

## 2021-09-08 DIAGNOSIS — Z23 Encounter for immunization: Secondary | ICD-10-CM

## 2021-09-08 NOTE — Progress Notes (Signed)
   Covid-19 Vaccination Clinic  Name:  Madeline Daniel    MRN: [id number] DOB: [birth year]  09/08/2021  [name] was observed post Covid-19 immunization for 15 minutes without incident. She was provided with Vaccine Information Sheet and instruction to access the V-Safe system.   [name] was instructed to call 911 with any severe reactions post vaccine: Difficulty breathing  Swelling of face and throat  A fast heartbeat  A bad rash all over body  Dizziness and weakness   Immunizations Administered     Name Date Dose VIS Date Route   Moderna Covid-19 vaccine Bivalent Booster 09/08/2021  2:37 PM 0.5 mL 06/07/2021 Intramuscular   Manufacturer: Moderna   Lot: [id number]   NDC: 67672-094-70

## 2021-09-09 ENCOUNTER — Encounter: Payer: Self-pay | Admitting: Internal Medicine

## 2021-09-10 ENCOUNTER — Other Ambulatory Visit: Payer: Self-pay | Admitting: Internal Medicine

## 2021-09-10 MED ORDER — METOPROLOL SUCCINATE ER 25 MG PO TB24: 25.0000 mg | ORAL_TABLET | Freq: Every day | ORAL | 1 refills | Status: DC

## 2021-09-10 MED ORDER — METOPROLOL SUCCINATE ER 25 MG PO TB24: 25.0000 mg | ORAL_TABLET | Freq: Every day | ORAL | 0 refills | Status: DC

## 2021-09-10 NOTE — Telephone Encounter (Signed)
Pt requesting refill on tramadol. New Pt- did you discuss taking over this prescription?

## 2021-09-14 ENCOUNTER — Encounter: Payer: Self-pay | Admitting: Internal Medicine

## 2021-09-15 ENCOUNTER — Other Ambulatory Visit: Payer: Self-pay | Admitting: Internal Medicine

## 2021-09-15 MED ORDER — TRAMADOL HCL 50 MG PO TABS: 50.0000 mg | ORAL_TABLET | Freq: Every day | ORAL | 0 refills | Status: DC | PRN

## 2021-09-16 MED ORDER — ALENDRONATE SODIUM 70 MG PO TABS: 70.0000 mg | ORAL_TABLET | ORAL | 3 refills | Status: DC

## 2021-09-16 NOTE — Addendum Note (Signed)
Addended byConrad Lewiston D on: 09/16/2021 02:39 PM   Modules accepted: Orders

## 2021-09-26 ENCOUNTER — Other Ambulatory Visit (HOSPITAL_BASED_OUTPATIENT_CLINIC_OR_DEPARTMENT_OTHER): Payer: Self-pay

## 2021-09-26 MED ORDER — MODERNA COVID-19 BIVAL BOOSTER 50 MCG/0.5ML IM SUSP
INTRAMUSCULAR | 0 refills | Status: DC
  Filled 2021-09-26: qty 0.5, 1d supply, fill #0

## 2021-10-29 DIAGNOSIS — M5412 Radiculopathy, cervical region: Secondary | ICD-10-CM | POA: Diagnosis not present

## 2021-11-03 ENCOUNTER — Encounter: Payer: Self-pay | Admitting: Internal Medicine

## 2021-11-03 ENCOUNTER — Ambulatory Visit (INDEPENDENT_AMBULATORY_CARE_PROVIDER_SITE_OTHER): Payer: Medicare Other | Admitting: Internal Medicine

## 2021-11-03 VITALS — BP 134/84 | HR 90 | Temp 98.0°F | Resp 18 | Ht 59.0 in | Wt 214.5 lb

## 2021-11-03 DIAGNOSIS — F32A Depression, unspecified: Secondary | ICD-10-CM

## 2021-11-03 DIAGNOSIS — I10 Essential (primary) hypertension: Secondary | ICD-10-CM

## 2021-11-03 DIAGNOSIS — F419 Anxiety disorder, unspecified: Secondary | ICD-10-CM | POA: Diagnosis not present

## 2021-11-03 DIAGNOSIS — G894 Chronic pain syndrome: Secondary | ICD-10-CM

## 2021-11-03 DIAGNOSIS — M810 Age-related osteoporosis without current pathological fracture: Secondary | ICD-10-CM

## 2021-11-03 DIAGNOSIS — Z79899 Other long term (current) drug therapy: Secondary | ICD-10-CM

## 2021-11-03 MED ORDER — NALOXONE HCL 4 MG/0.1ML NA LIQD
1.0000 | Freq: Once | NASAL | 1 refills | Status: DC
Start: 2021-11-03 — End: 2021-11-06

## 2021-11-03 MED ORDER — ALPRAZOLAM 0.25 MG PO TABS: 0.2500 mg | ORAL_TABLET | Freq: Every day | ORAL | 0 refills | Status: DC | PRN

## 2021-11-03 NOTE — Patient Instructions (Addendum)
Check the  blood pressure regularly BP GOAL is between 110/65 and  135/85. If it is consistently higher or lower, let me know  Take over-the-counter calcium 1 g daily and vitamin D approximately 2000 units a day  Please read information regards advance care planning  You are taking painkiller and Xanax.  Is important you keep Narcan with you in case you get oversedated.  Please discuss this with your family.   GO TO THE LAB : Get the blood work     GO TO THE FRONT DESK, PLEASE SCHEDULE YOUR APPOINTMENTS Come back for   a physical exam in 4 months

## 2021-11-03 NOTE — Assessment & Plan Note (Addendum)
HTN: BP today is very good, at home she gets similar readings.  On amlodipine, HCTZ, metoprolol.  Not taking KCl.  Check BMP. Anxiety depression: See last visit, Cymbalta dose increased, she is feeling better, request a prescription of Xanax.  PDMP reviewed. Also takes Ultram thus will rx  Narcan.  See AVS. Osteoporosis: History of a T12 compression fracture, T score -2.31 August 2021, Rx Fosamax.  Tolerating well.  We will check a vitamin D level, recommend to take OTC vitamin D and calcium (as long as she does not get constipated) Cervical radiculopathy: Follow-up elsewhere. DJD, gait disorder, frequent falls: Request parking permit, I agree, needs a permanent parking permit. Preventive care: UTD on COVID and flu.  Information about ACP provided. RTC 4 months CPX

## 2021-11-03 NOTE — Progress Notes (Addendum)
Subjective:    Patient ID: Madeline Daniel, female    DOB: [birth year], 81 y.o.   MRN: [id number]  DOS:  11/03/2021 Type of visit - description: Here with her daughter.  Since the last office visit is doing well and actually has no major concerns for me. Has developed a radiculopathy, seen at another practice.   Review of Systems See above   Past Medical History:  Diagnosis Date   Anxiety    Arthritis    Atherosclerosis of aorta (HCC)    Barrett's esophagus    Bleeding ulcer 2005   CKD (chronic kidney disease), stage III (HCC)    Depression    Essential tremor    Gait disorder    GERD (gastroesophageal reflux disease)    History of rib fracture    multiple   History of stomach ulcers    Hyperlipidemia    Hypertension    Insomnia    Meniere disease    Nonalcoholic liver disease, chronic    Peptic ulcer disease    TIA (transient ischemic attack)    Vertigo     Past Surgical History:  Procedure Laterality Date   CATARACT EXTRACTION Bilateral    CHOLECYSTECTOMY     REPLACEMENT TOTAL KNEE BILATERAL Bilateral     Current Outpatient Medications  Medication Instructions   albuterol (VENTOLIN HFA) 108 (90 Base) MCG/ACT inhaler 1-2 puffs, Inhalation, Every 6 hours PRN   alendronate (FOSAMAX) 70 mg, Oral, Weekly, Take with a full glass of water on an empty stomach. Remain sitting upright for 45-60 mins   ALPRAZolam (XANAX) 0.25-0.5 mg, Oral, Daily PRN   amLODipine (NORVASC) 5 mg, Oral, Daily   atorvastatin (LIPITOR) 40 mg, Oral, Daily   DULoxetine (CYMBALTA) 60 mg, Oral, Daily, Take with 30mg  capsule to equal 90 mg daily   DULoxetine (CYMBALTA) 30 mg, Oral, Daily, Take with 60mg  capsule to equal 90mg  daily   folic acid (FOLVITE) 1 mg, Oral, Daily   hydrochlorothiazide (HYDRODIURIL) 12.5 mg, Oral, Daily   lisinopril (ZESTRIL) 40 mg, Oral, Daily   metoprolol succinate (TOPROL-XL) 25 mg, Oral, Daily, Take with or immediately following a meal.   naloxone (NARCAN) nasal  spray 4 mg/0.1 mL 1 spray, Nasal,  Once, As directed   omeprazole (PRILOSEC) 20 mg, Oral, 2 times daily before meals   ondansetron (ZOFRAN) 4 mg, Oral, Every 8 hours PRN   primidone (MYSOLINE) 100 mg, Oral, Daily at bedtime   traMADol (ULTRAM) 50 mg, Oral, Daily PRN       Objective:   Physical Exam BP 134/84 (BP Location: Left Arm, Patient Position: Sitting, Cuff Size: Normal)    Pulse 90    Temp 98 F (36.7 C) (Oral)    Resp 18    Ht 4\' 11"  (1.499 m)    Wt 214 lb 8 oz (97.3 kg)    SpO2 98%    BMI 43.32 kg/m  General:   Well developed, NAD, BMI noted. HEENT:  Normocephalic . Face symmetric, atraumatic Lungs:  CTA B Normal respiratory effort, no intercostal retractions, no accessory muscle use. Heart: RRR,  no murmur.  Lower extremities: no pretibial edema bilaterally  Skin: Not pale. Not jaundice Neurologic:  alert & oriented X3.  Speech normal, gait assisted by a walker  psych--  Cognition and judgment appear intact.  Cooperative with normal attention span and concentration.  Behavior appropriate. No anxious or depressed appearing.      Assessment     Assessment (new patient, referred by her  daughter Madeline Daniel) Hyperglycemia HTN High cholesterol Anxiety depression GI: -GERD, h/o esophageal dilatations , Barrett's  (see GI note 03/26/2020) -EGD:  04/02/2020: Normal duodenum, suggestion of Barrett's esophagus, hiatal hernia (dilatation), polyp stomach. Pathology not available -Question of liver cirrhosis, incidental finding on CTA 11/05/2019. NEURO -Essential Tremor, onset age 64s  on primidone -Brain MRIvolume loss and small vessel ischemic disease. -H./o TIA  -Menire, lack of balance, zofran prn Bronchospasm  DJD knee-hands-neck , tramadol prn Gait d/o: due to dizziness >> DJD; uses a walker, h/o frequent fall Social: Move with her daughter Madeline Daniel from [place], plans to live in [place] during the winters   PLAN:  HTN: BP today is very good, at home she gets  similar readings.  On amlodipine, HCTZ, metoprolol.  Not taking KCl.  Check BMP. Anxiety depression: See last visit, Cymbalta dose increased, she is feeling better, request a prescription of Xanax.  PDMP reviewed. Also takes Ultram thus will rx  Narcan.  See AVS. Osteoporosis: History of a T12 compression fracture, T score -2.31 August 2021, Rx Fosamax.  Tolerating well.  We will check a vitamin D level, recommend to take OTC vitamin D and calcium (as long as she does not get constipated) Cervical radiculopathy: Follow-up elsewhere. DJD, gait disorder, frequent falls: Request parking permit, I agree, needs a permanent parking permit. Preventive care: UTD on COVID and flu.  Information about ACP provided. RTC 4 months CPX    This visit occurred during the SARS-CoV-2 public health emergency.  Safety protocols were in place, including screening questions prior to the visit, additional usage of staff PPE, and extensive cleaning of exam room while observing appropriate contact time as indicated for disinfecting solutions.

## 2021-11-04 ENCOUNTER — Telehealth: Payer: Self-pay

## 2021-11-04 LAB — BASIC METABOLIC PANEL
BUN: 17 mg/dL (ref 6–23)
CO2: 29 mEq/L (ref 19–32)
Calcium: 9.5 mg/dL (ref 8.4–10.5)
Chloride: 91 mEq/L — ABNORMAL LOW (ref 96–112)
Creatinine, Ser: 1.04 mg/dL (ref 0.40–1.20)
GFR: 50 mL/min — ABNORMAL LOW (ref >60.00)
Glucose, Bld: 116 mg/dL — ABNORMAL HIGH (ref 70–99)
Potassium: 4.5 mEq/L (ref 3.5–5.1)
Sodium: 130 mEq/L — ABNORMAL LOW (ref 135–145)

## 2021-11-04 LAB — VITAMIN D 25 HYDROXY (VIT D DEFICIENCY, FRACTURES): VITD: 8.13 ng/mL — ABNORMAL LOW (ref 30.00–100.00)

## 2021-11-04 NOTE — Telephone Encounter (Signed)
Medical records received from [facility] from [place], [place]. Records placed in PCP yellow folder for review.

## 2021-11-06 ENCOUNTER — Other Ambulatory Visit: Payer: Self-pay

## 2021-11-06 LAB — DRUG TOX MONITOR 1 W/CONF, ORAL FLD
Amobarbital: NEGATIVE ng/mL (ref <10)
Amphetamines: NEGATIVE ng/mL (ref <10)
Barbiturates: POSITIVE ng/mL — AB (ref <10)
Benzodiazepines: NEGATIVE ng/mL (ref <0.50)
Buprenorphine: NEGATIVE ng/mL (ref <0.10)
Butalbital: NEGATIVE ng/mL (ref <10)
Cocaine: NEGATIVE ng/mL (ref <5.0)
Fentanyl: NEGATIVE ng/mL (ref <0.10)
Heroin Metabolite: NEGATIVE ng/mL (ref <1.0)
MARIJUANA: NEGATIVE ng/mL (ref <2.5)
MDMA: NEGATIVE ng/mL (ref <10)
Meprobamate: NEGATIVE ng/mL (ref <2.5)
Methadone: NEGATIVE ng/mL (ref <5.0)
Nicotine Metabolite: NEGATIVE ng/mL (ref <5.0)
Opiates: NEGATIVE ng/mL (ref <2.5)
Pentobarbital: NEGATIVE ng/mL (ref <10)
Phencyclidine: NEGATIVE ng/mL (ref <10)
Phenobarbital: 45 ng/mL — ABNORMAL HIGH (ref <10)
Secobarbital: NEGATIVE ng/mL (ref <10)
Tapentadol: NEGATIVE ng/mL (ref <5.0)
Tramadol: 221.6 ng/mL — ABNORMAL HIGH (ref <5.0)
Tramadol: POSITIVE ng/mL — AB (ref <5.0)
Zolpidem: NEGATIVE ng/mL (ref <5.0)

## 2021-11-06 MED ORDER — VITAMIN D (ERGOCALCIFEROL) 1.25 MG (50000 UNIT) PO CAPS: 50000.0000 [IU] | ORAL_CAPSULE | ORAL | 0 refills | Status: DC

## 2021-11-06 NOTE — Addendum Note (Signed)
Addended byDamita Dunnings D on: 11/06/2021 03:05 PM   Modules accepted: Orders

## 2021-11-07 NOTE — Telephone Encounter (Signed)
EGD 05/10/2012: Pathology gastric polyp, hyperplastic.  Gastritis with mild increase in eosinophils.  No H. pylori  EGD 09/24/2016: Normal duodenum, hiatal hernia.  Pathology: - Esophagus gastric mucosa with mild chronic inflammation.  No Barrett's - Stomach: Reactive gastropathy, no H. pylori - Duodenal biopsy negative.  CT abdomen pelvis 11/05/2019: Age indeterminate T12 vertebral fracture. 7 rib fracture Solid organs with no evidence of trauma. Liver with question of cirrhosis anatomy. L ovarian cyst  01/23/2020: SPEP pattern could be seen in acute or chronic inflammation and liver disease.  Serum kappa and lambda: Moderate slightly elevated Both slightly elevated.  Records to be scanned free: 49.6 is slightly elevated  GI note from 03-26-2020: History of alcoholic cirrhosis.  They recommended EGD with dilatation.  Had EGD 04/02/2020

## 2021-11-10 DIAGNOSIS — M5412 Radiculopathy, cervical region: Secondary | ICD-10-CM | POA: Diagnosis not present

## 2021-11-15 ENCOUNTER — Encounter: Payer: Self-pay | Admitting: Internal Medicine

## 2021-11-17 ENCOUNTER — Encounter: Payer: Self-pay | Admitting: Internal Medicine

## 2021-11-17 ENCOUNTER — Telehealth: Payer: Self-pay | Admitting: *Deleted

## 2021-11-17 ENCOUNTER — Other Ambulatory Visit: Payer: Self-pay

## 2021-11-17 DIAGNOSIS — E785 Hyperlipidemia, unspecified: Secondary | ICD-10-CM

## 2021-11-17 DIAGNOSIS — I10 Essential (primary) hypertension: Secondary | ICD-10-CM

## 2021-11-17 NOTE — Chronic Care Management (AMB) (Signed)
Chronic Care Management   Note  11/17/2021 Name: SHALONDRA WUNSCHEL MRN: [id number] DOB: [birth year]  NANAMI WHITELAW is a 81 y.o. year old female who is a primary care patient of Colon Branch, MD. I reached out to [name] by phone today in response to a referral sent by Ms. Yisroel Ramming Lyford's PCP.  Ms. Wigington was given information about Chronic Care Management services today including:  CCM service includes personalized support from designated clinical staff supervised by her physician, including individualized plan of care and coordination with other care providers 24/7 contact phone numbers for assistance for urgent and routine care needs. Service will only be billed when office clinical staff spend 20 minutes or more in a month to coordinate care. Only one practitioner may furnish and bill the service in a calendar month. The patient may stop CCM services at any time (effective at the end of the month) by phone call to the office staff. The patient is responsible for co-pay (up to 20% after annual deductible is met) if co-pay is required by the individual health plan.   Patient agreed to services and verbal consent obtained.   Follow up plan: Telephone appointment with care management team member scheduled for: 12/03/2021  Julian Hy, [place] Management  Direct Dial: [phone]

## 2021-11-20 ENCOUNTER — Ambulatory Visit (INDEPENDENT_AMBULATORY_CARE_PROVIDER_SITE_OTHER): Payer: Medicare Other | Admitting: Pharmacist

## 2021-11-20 DIAGNOSIS — M810 Age-related osteoporosis without current pathological fracture: Secondary | ICD-10-CM

## 2021-11-20 DIAGNOSIS — E785 Hyperlipidemia, unspecified: Secondary | ICD-10-CM

## 2021-11-20 DIAGNOSIS — I10 Essential (primary) hypertension: Secondary | ICD-10-CM

## 2021-11-20 DIAGNOSIS — G250 Essential tremor: Secondary | ICD-10-CM

## 2021-11-20 DIAGNOSIS — M159 Polyosteoarthritis, unspecified: Secondary | ICD-10-CM

## 2021-11-20 DIAGNOSIS — M150 Primary generalized (osteo)arthritis: Secondary | ICD-10-CM

## 2021-11-20 MED ORDER — DULOXETINE HCL 30 MG PO CPEP: 30.0000 mg | ORAL_CAPSULE | Freq: Every day | ORAL | 0 refills | Status: DC

## 2021-11-21 ENCOUNTER — Other Ambulatory Visit: Payer: Self-pay | Admitting: Internal Medicine

## 2021-11-21 MED ORDER — ONDANSETRON HCL 4 MG PO TABS: 4.0000 mg | ORAL_TABLET | Freq: Three times a day (TID) | ORAL | 0 refills | Status: DC | PRN

## 2021-11-21 NOTE — Chronic Care Management (AMB) (Signed)
Chronic Care Management Pharmacy Note  11/23/2021 Name:  JAALIYAH LUCATERO MRN:  [id number] DOB:  January 16, 1941  Summary: Reviewed medications for potential medications that would lead to positive urine drug screen to be positive for phenobarbital. Patient is taking primidone 135m at bedtime for essential tremor. This medication is metabolism in the body to phenobarbital.  Reviewed refill history and adherence. Patient endorses that she is taking medications regularly. It would appear that she is due to refill lisinopril and refill however patient and her daughter report that she has about 30 days remaining. Refill history shows that lisinopril has been filled 2 weeks early every 3 months in 2022 which has lead to a surplus of about 1 month. Called [facility] but unable to change cadence of refill reminder calls that patient receives. Patient can decline refills if she has plenty when she gets automated call. Educated patient about this process.   Discussed that patient was having difficulty sleeping. I think that part of her issue is that she is not very active during the day. Discussed possibly doing aquatic physical therapy since patient has pain in knees and back. She is open to aquatic PT but [place] will be [place] for about 1 or 2 months. She [place]   Plan: [place]  Subjective: SLUCELIA LACEYis an 81y.o. year old female who is a primary patient of Paz, JAlda Berthold MD.  The CCM team was consulted for assistance with disease management and care coordination needs.    Engaged with patient by telephone for initial visit in response to provider referral for pharmacy case management and/or care coordination services.   Consent to Services:  The patient was given the following information about Chronic Care Management services today, agreed to services, and gave verbal  consent: 1. CCM service includes personalized support from designated clinical staff supervised by the primary care provider, including individualized plan of care and coordination with other care providers 2. 24/7 contact phone numbers for assistance for urgent and routine care needs. 3. Service will only be billed when office clinical staff spend 20 minutes or more in a month to coordinate care. 4. Only one practitioner may furnish and bill the service in a calendar month. 5.The patient may stop CCM services at any time (effective at the end of the month) by phone call to the office staff. 6. The patient will be responsible for cost sharing (co-pay) of up to 20% of the service fee (after annual deductible is met). Patient agreed to services and consent obtained.  Patient Care Team: PColon Branch MD as PCP - General (Internal Medicine) ECherre Robins RBlackfoot(Pharmacist)  Social History:  [place]  Patient is widowed and retired.   Objective:  Lab Results  Component Value Date   CREATININE 1.04 11/03/2021   CREATININE 0.95 07/01/2021   CREATININE 1.0 11/08/2020    Lab Results  Component Value Date   HGBA1C 6.4 07/01/2021   Last diabetic Eye exam: No results found for: HMDIABEYEEXA  Last diabetic Foot exam: No results found for: HMDIABFOOTEX  Component Value Date/Time   CHOL 155 11/08/2020 0000   TRIG 132 11/08/2020 0000   HDL 62 11/08/2020 0000   LDLCALC 71 11/08/2020 0000    Hepatic Function Latest Ref Rng & Units 07/01/2021 11/08/2020  Total Protein 6.0 - 8.3 g/dL 7.5 -  Albumin 3.5 - 5.2 g/dL 3.8 3.7  AST 0 - 37 U/L 25 24  ALT 0 - 35 U/L 20 17  Alk Phosphatase 39 - 117 U/L 105 112  Total Bilirubin 0.2 - 1.2 mg/dL 0.5 -    Lab Results  Component Value Date/Time    TSH 2.53 07/01/2021 02:48 PM    CBC Latest Ref Rng & Units 07/01/2021  WBC 4.0 - 10.5 K/uL 8.8  Hemoglobin 12.0 - 15.0 g/dL 13.7  Hematocrit 36.0 - 46.0 % 40.5  Platelets 150.0 - 400.0 K/uL 282.0    Lab Results  Component Value Date/Time   VD25OH 8.13 (L) 11/03/2021 02:31 PM    Clinical ASCVD: Yes  The ASCVD Risk score (Arnett DK, et al., 2019) failed to calculate for the following reasons:   The 2019 ASCVD risk score is only valid for ages 63 to 44    DEXA 09/08/2021: Femur Neck Right  T-score = -2.6.  AP Spine L1-L4 T-score = -0.2  This patient is considered osteoporotic according to [facility]) criteria. The scan quality is good.  Social History   Tobacco Use  Smoking Status Never  Smokeless Tobacco Never   BP Readings from Last 3 Encounters:  11/03/21 134/84  09/01/21 (!) 144/72  07/01/21 122/66   Pulse Readings from Last 3 Encounters:  11/03/21 90  09/01/21 (!) 101  07/01/21 77   Wt Readings from Last 3 Encounters:  11/03/21 214 lb 8 oz (97.3 kg)  09/01/21 214 lb 8 oz (97.3 kg)  07/01/21 220 lb 4 oz (99.9 kg)    Assessment: Review of patient past medical history, allergies, medications, health status, including review of consultants reports, laboratory and other test data, was performed as part of comprehensive evaluation and provision of chronic care management services.   SDOH:  (Social Determinants of Health) assessments and interventions performed:  SDOH Interventions    Flowsheet Row Most Recent Value  SDOH Interventions   Financial Strain Interventions Intervention Not Indicated  Physical Activity Interventions Other (Comments)  [Discuss aquatic physical therapy - patient is considering]       CCM Care Plan  No Known Allergies  Medications Reviewed Today     Reviewed by Cherre Robins, RPH-CPP (Pharmacist) on 11/20/21 at [phone]  Med List Status: <None>   Medication Order Taking? Sig Documenting Provider Last Dose Status  Informant  albuterol (VENTOLIN HFA) 108 (90 Base) MCG/ACT inhaler [id number] Yes Inhale 1-2 puffs into the lungs every 6 (six) hours as needed for wheezing or shortness of breath. [provider] Taking Active   alendronate (FOSAMAX) 70 MG tablet [id number] Yes Take 1 tablet (70 mg total) by mouth once a week. Take with a full glass of water on an empty stomach. Remain sitting upright for 45-60 mins Colon Branch, MD Taking Active            Med Note [facility], Adria Dill Nov 20, 2021  8:55 AM) Dewaine Conger on Wednesdays  ALPRAZolam Duanne Moron) 0.25 MG tablet [id number] Yes Take 1-2 tablets (0.25-0.5 mg total) by mouth daily as needed for anxiety. Colon Branch, MD Taking Active   amLODipine (NORVASC) 5 MG tablet [id number] Yes Take 5 mg  by mouth daily. [provider] Taking Active   atorvastatin (LIPITOR) 40 MG tablet [id number] Yes Take 40 mg by mouth daily. [provider] Taking Active   DULoxetine (CYMBALTA) 30 MG capsule [id number] Yes Take 1 capsule (30 mg total) by mouth daily. Take with 60m capsule to equal 965mdaily PaColon BranchMD Taking Active   DULoxetine (CYMBALTA) 60 MG capsule [id number] Take 60 mg by mouth daily. Take with 3030mapsule to equal 90 mg daily [provider] Taking Active   folic acid (FOLVITE) 0.5 MG tablet [id number] Take 1 mg by mouth daily. [provider] Taking Active   hydrochlorothiazide (HYDRODIURIL) 12.5 MG tablet [id number] Take 12.5 mg by mouth daily. [provider] Taking Active   lisinopril (ZESTRIL) 40 MG tablet [id number] Take 40 mg by mouth daily. [provider] Taking Active   metoprolol succinate (TOPROL-XL) 25 MG 24 hr tablet [id number] Take 1 tablet (25 mg total) by mouth daily. Take with or immediately following a meal. PazColon BranchD Taking Active   naloxone (NASouth [facility] End[facility] Well[facility] Center LLCasal spray 4 mg/0.1 mL [id number] Place 1 spray into the nose. As directed PazColon BranchD Taking Active    omeprazole (PRILOSEC) 20 MG capsule [id number] Take 20 mg by mouth 2 (two) times daily before a meal. [provider] Taking Active   ondansetron (ZOFRAN) 4 MG tablet [id number] Take 1 tablet (4 mg total) by mouth every 8 (eight) hours as needed for nausea or vomiting. PazColon BranchD Taking Active   primidone (MYSOLINE) 50 MG tablet [id number] Take 100 mg by mouth at bedtime. [provider] Taking Active   traMADol (ULTRAM) 50 MG tablet [id number] Take 1 tablet (50 mg total) by mouth daily as needed. PazColon BranchD Taking Active   Vitamin D, Ergocalciferol, (DRISDOL) 1.25 MG (50000 UNIT) CAPS capsule [id number] Take 1 capsule (50,000 Units total) by mouth every 7 (seven) days. PazColon BranchD Taking Active            Med Note (ECTaylorAMAdria Dilln 26, 2023  8:56 AM) TakDewaine Conger Satrudays            Patient Active Problem List   Diagnosis Date Noted   Hypertension, essential 08/07/2021   Hyperlipidemia 08/07/2021   Anxiety and depression 08/07/2021   Chronic nonalcoholic liver disease 10/59/93/5701GERD (gastroesophageal reflux disease) 08/07/2021   Meniere's disease 08/07/2021   CKD (chronic kidney disease), stage III (HCC[place] Lake0/13/2022   Severe depression (HCCD[place]0/13/2022   Atherosclerosis of aorta (HCCInkom0/13/2022   Chronic pain syndrome 08/07/2021   Barrett's esophagus 08/07/2021   Peptic ulcer disease 08/07/2021   Primary osteoarthritis involving multiple joints 07/02/2021   PCP NOTES >>>>>>>>>>>>>>> 07/01/2021   Vertigo 10/17/2019   Essential tremor 10/17/2019   Abnormal gait 10/17/2019   Abnormal MRI of head 10/17/2019   Ocular hypertension, 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   Dry eye syndrome, bilateral 01/07/2018   PVD (posterior vitreous detachment) 11/18/2015   PCO (posterior capsular opacification) 11/18/2015   Degenerative retinal drusen of both eyes 11/18/2015   Biliary dyskinesia 04/04/2015    Immunization History  Administered Date(s)  Administered   Fluad Quad(high Dose 65+) 09/01/2021   Moderna Covid-19 Vaccine Bivalent Booster 18y10yrup 09/08/2021   Moderna Sars-Covid-2 Vaccination 11/15/2019, 12/13/2019, 08/26/2020, 02/13/2021   Pneumococcal Polysaccharide-23 12/02/2020   Tdap 05/15/2021    Conditions to be addressed/monitored: CAD,  HTN, HLD, CKD Stage 3, Anxiety, Depression, GERD, Osteoporosis, and Barrett's esophagus, low serum vitamin D; chronic pain; essential tremor; hyponatremia;   Care Plan : General Pharmacy (Adult)  Updates made by Cherre Robins, RPH-CPP since 11/23/2021 12:00 AM     Problem: Chronic Conditions: HDL; HTN; atherosclerosis of aorta; hyponatremia; chronic pain; low vitamin D; essential tremor; pre diabetes; GERD with Barrett's esophagus and history of ulcer; anxiety with depression; NASH; HTN   Priority: High  Onset Date: 11/20/2021     Long-Range Goal: Provide education, support and care coordination for medication therapy and chronic conditions   Start Date: 11/20/2021  Priority: High  Note:   Current Barriers:  Unable to achieve control of pain (but has improved recently)   Taking high risk medications per [employer] list - monitoring   Pharmacist Clinical Goal(s):  Over the next 90 days, patient will achieve adherence to monitoring guidelines and medication adherence to achieve therapeutic efficacy achieve control of pain  as evidenced by increased activity and patient self report of improved pain level maintain control of hypertension and prediabetes as evidenced by achieving goal mentioned below  adhere to prescribed medication regimen as evidenced by refill history   through collaboration with PharmD and provider.   Interventions: 1:1 collaboration with Colon Branch, MD regarding development and update of comprehensive plan of care as evidenced by provider attestation and co-signature Inter-disciplinary care team collaboration (see longitudinal plan of care) Comprehensive medication  review performed; medication list updated in electronic medical record  Chronic Pain Syndrome:  Goal: decrease pain and improve activity Currently therapy:  Tramadol 55m once a day as needed Duloxetine 362m+ 6028mapules daily to equal a total daily dose of 36m106mevious medications: gabapentin and hydrocodone/acetaminophen Patient has been seeing Dr RamoNelva Bush CaroLevy Pupa for epidural and nerve blockl  Reports pain has improved.  She is not exercising or able to walk much due to both back and knee pain. States that since she had knee surgery several years ago she has not slept well. Daughter think it is due to low activity.  Interventions:  Recommended try moving duloxetine from night to morning to see if this helps with difficulty sleeping Continue to follow up with Dr RamoNelva Bush CaroLevy Pupa Discussed aquatic physical therapy. Patient leave [place] to [place]. She declined referral to aquatic physical therapy at this time but is interested in pursuing when she returns  Essential Tremor:  Goal: decrease symptoms of essential tremor  Currently therapy:  Primidone 50mg1make 2 tablets = 100mg 71medtime.  Patient reports she has seen significant improvement in the last few years since she started primidone.  Urine drug screen 11/03/2021 was positive for phenobarbital. Primidone is metabolized to phenobarbital, therefore phenobarbital would be expected in a urine test of a patient taking primidone.  Interventions:  Provided education to patient and patients daughter regarding recent drug screen and results. Explained that results was expected Consulted with PCP regarding phenobarbital being a metabolite of primidone.   Osteoporosis / Low Vitamin D:  Goal: Prevent fractures and achieve therapeutic levels of serum vitamin D.  DEXA 09/08/2021: Femur Neck Right  T-score = -2.6.  AP Spine L1-L4 T-score = -0.2   Last vitamin D Lab Results  Component  Value Date   VD25OH 8.13 (L) 11/03/2021  Current therapy:  Alendronate 70mg -88me 1 tablet once a week on an empty stomach, prior to eating or taking  other medications; Take with a full glass (at least 4 ounces of water) Do not eat, drink, lie down or recline for 30 minutes after taking alendronate.  Vitamin D 50,000 units weekly - started 11/06/2021 Patient is using walker when ambulating Interventions:  Discussed administration recommendations for alendronate Discussed Physical therapy - see able.  Will try to minimize medications that can increase risk of fall (try to lower dose of duloxtine and tramadol in future if other interventions help with pain)   Hypertension: Controlled; blood pressure goal 140/90 Current treatment: Lisinopril 19m daily Hydrochlorothiazide 268mdaily  Amlodipine 47m6maily  Metoprolol succinate ER 247m54mily   Current home readings: 130 to 145 / 70's (only checks about 1 time per week) Denies hypotensive/hypertensive symptoms Interventions:  Discussed blood pressure goal Recommended continue current therapy  Hyperlipidemia: Uncontrolled; LDL goal <70 (due to atherosclerosis of aorta) current treatment: Atorvastatin 40mg53mly  Current dietary patterns:  tries to limit high fat and fried foods Interventions:  Continue to take atorvastatin 40mg 28my Very close to LDL goal - continue to limit saturated fat intake.   Medication management Pharmacist Clinical Goal(s): Over the next 90 days, patient will work with PharmD and providers to maintain optimal medication adherence [facility] Interventions Comprehensive medication review performed. Reviewed refill history and assessed adherence [facility]ed [facility] regarding upcoming deliveries. [place]. [place] refill with [facility] for duloxetine Patient requesting refill on amlodipine and metoprolol - per [facility]  medication was refilled recently ad [place] Patient has about 25 day supply of each and will last until she returns to [place]. [place] current medication management strategy Patient self care activities - Over the next 90 days, patient will: Focus on medication adherence by filling medications appropriately  Take medications as prescribed Report any questions or concerns to PharmD and/or provider(s)  Patient Goals/Self-Care Activities Over the next 90 days, patient will:  take medications as prescribed,  focus on medication adherence by filling medications on time,  check blood pressure 1 to 2 times per week, document, and provide at future appointments,  engage in dietary modifications by limiting intake of fried foods and saturated fat consider aquatic physical therapy ([place] to see if available [place], [place] d[place] - move to morning to see if helps with sleep.  Follow Up Plan: Telephone follow up appointment with care management team member scheduled for:  3 months             Medication Assistance: None required.  Patient affirms current coverage meets needs.  [facility]  [facility] - [phone]: [phone] [address][id number] -[address] [phone] [phone] Up:  Patient agrees to Care Plan and Follow-up.  Plan: Telephone follow up appointment with care management team member scheduled for:  2 to 3 months   ECherre RobinsD Clinical Pharmacist [facility]

## 2021-11-23 NOTE — Patient Instructions (Addendum)
Ms. Ebony Cargo It was a pleasure speaking with you today.  I have attached a summary of our visit today and information about your health goals.   Patient Goals/Self-Care Activities Over the next 90 days, patient will:  take medications as prescribed,  focus on medication adherence by filling medications on time,  check blood pressure 1 to 2 times per week, document, and provide at future appointments,  engage in dietary modifications by limiting intake of fried foods and saturated fat consider aquatic physical therapy (check to see if available in [place], if not can [place])  Change timing of duloxetine - move to morning to see if helps with sleep.  Our next appointment is by telephone on April 27th at 8:30am  Please call the care guide team at [phone] if you need to cancel or reschedule your appointment.    If you have any questions or concerns, please feel free to contact me either at the phone number below or with a MyChart message.   Keep up the good work!  Henrene Pastor, PharmD Clinical Pharmacist [facility] [phone] (direct line)  [phone] (main office number)  Chronic Pain Syndrome:  Goal: decrease pain and improve activity Currently therapy:  Tramadol 50mg  once a day as needed Duloxetine 30mg  + 60mg  capules daily to equal a total daily dose of 90mg  Interventions:  Recommended try moving duloxetine from night to morning to see if this helps with difficulty sleeping Continue to follow up with Dr and , NP Discussed aquatic physical therapy. Patient leave in 10 days to return to for a few months. She declined referral to aquatic physical therapy at this time but is interested in pursuing when she returns  Essential Tremor:  Goal: decrease symptoms of essential tremor  Currently therapy:  Primidone 50mg  - take 2 tablets = 100mg  at bedtime.  Interventions:  Provided education to  patient and patient's daughter regarding recent drug screen and results. Explained that results were as expected Consulted with PCP regarding phenobarbital being a metabolite of primidone.   Osteoporosis / Low Vitamin D:  Goal: Prevent fractures and achieve therapeutic levels of serum vitamin D.   Last vitamin D Lab Results  Component Value Date   VD25OH 8.13 (L) 11/03/2021   Current therapy:  Alendronate 70mg  - take 1 tablet once a week on an empty stomach, prior to eating or taking other medications; Take with a full glass (at least 4 ounces of water) Do not eat, drink, lie down or recline for 30 minutes after taking alendronate.  Vitamin D 50,000 units weekly - started 11/06/2021 Interventions:  Discussed administration recommendations for alendronate Discussed Physical therapy - see able.  Will try to minimize medications that can increase risk of fall   Hypertension: Controlled; blood pressure goal 140/90 BP Readings from Last 3 Encounters:  11/03/21 134/84  09/01/21 (!) 144/72  07/01/21 122/66   Current treatment: Lisinopril 40mg  daily Hydrochlorothiazide 25mg  daily  Amlodipine 5mg  daily  Metoprolol succinate ER 25mg  daily   Interventions:  Discussed blood pressure goal Recommended continue current therapy Continue to check blood pressure 1 to 2 times per week and record  Hyperlipidemia: Uncontrolled; LDL goal <70 (due to atherosclerosis of aorta) Lab Results  Component Value Date   LDLCALC 71 11/08/2020   current treatment: Atorvastatin 40mg  daily  Current dietary patterns:  tries to limit high fat and fried foods Interventions:  Continue to take atorvastatin 40mg  daily Very close  to LDL goal - continue to limit saturated fat intake.   Medication management Pharmacist Clinical Goal(s): Over the next 90 days, patient will work with PharmD and providers to maintain optimal medication adherence [facility] Interventions Comprehensive  medication review performed. Reviewed refill history and assessed adherence [facility]ed [facility] regarding upcoming deliveries.  Coordinated refill with [facility] for duloxetine Patient requesting refill on amlodipine and metoprolol - per [facility] medication was refilled recently ad delivered to her [place] address. Patient has about 25 day supply of each and will last until she returns to [place].  Continue current medication management strategy  Patient Goals/Self-Care Activities Over the next 90 days, patient will:  take medications as prescribed,  focus on medication adherence by filling medications on time,  check blood pressure 1 to 2 times per week, document, and provide at future appointments,  engage in dietary modifications by limiting intake of fried foods and saturated fat consider aquatic physical therapy (check to see if available in [place], if not can start when you return to [place])  Change timing of duloxetine - move to morning to see if helps with sleep.  Follow Up Plan: Telephone follow up appointment with care management team member scheduled for:  3 months   Patient verbalizes understanding of instructions and care plan provided today and agrees to view in MyChart. Active MyChart status confirmed with patient.

## 2021-11-25 DIAGNOSIS — E785 Hyperlipidemia, unspecified: Secondary | ICD-10-CM | POA: Diagnosis not present

## 2021-11-25 DIAGNOSIS — M810 Age-related osteoporosis without current pathological fracture: Secondary | ICD-10-CM

## 2021-11-25 DIAGNOSIS — I10 Essential (primary) hypertension: Secondary | ICD-10-CM

## 2021-11-25 DIAGNOSIS — M159 Polyosteoarthritis, unspecified: Secondary | ICD-10-CM | POA: Diagnosis not present

## 2021-12-01 DIAGNOSIS — R42 Dizziness and giddiness: Secondary | ICD-10-CM | POA: Diagnosis not present

## 2021-12-02 ENCOUNTER — Encounter: Payer: Self-pay | Admitting: Internal Medicine

## 2021-12-03 ENCOUNTER — Telehealth: Payer: Medicare Other

## 2021-12-03 MED ORDER — ALPRAZOLAM 0.25 MG PO TABS: 0.2500 mg | ORAL_TABLET | Freq: Every day | ORAL | 1 refills | Status: DC | PRN

## 2021-12-03 NOTE — Addendum Note (Signed)
Addended by: Willow Ora E on: 12/03/2021 01:59 PM   Modules accepted: Orders

## 2021-12-03 NOTE — Telephone Encounter (Signed)
PDMP okay, advise patient I will refill her Xanax to the local pharmacy.

## 2021-12-11 DIAGNOSIS — R059 Cough, unspecified: Secondary | ICD-10-CM | POA: Diagnosis not present

## 2021-12-11 DIAGNOSIS — J209 Acute bronchitis, unspecified: Secondary | ICD-10-CM | POA: Diagnosis not present

## 2021-12-11 DIAGNOSIS — Z5181 Encounter for therapeutic drug level monitoring: Secondary | ICD-10-CM | POA: Diagnosis not present

## 2021-12-11 DIAGNOSIS — Z8719 Personal history of other diseases of the digestive system: Secondary | ICD-10-CM | POA: Diagnosis not present

## 2021-12-11 DIAGNOSIS — R0602 Shortness of breath: Secondary | ICD-10-CM | POA: Diagnosis not present

## 2021-12-11 DIAGNOSIS — E7800 Pure hypercholesterolemia, unspecified: Secondary | ICD-10-CM | POA: Diagnosis not present

## 2021-12-11 DIAGNOSIS — R531 Weakness: Secondary | ICD-10-CM | POA: Diagnosis not present

## 2021-12-11 DIAGNOSIS — R112 Nausea with vomiting, unspecified: Secondary | ICD-10-CM | POA: Diagnosis not present

## 2021-12-11 DIAGNOSIS — Z79899 Other long term (current) drug therapy: Secondary | ICD-10-CM | POA: Diagnosis not present

## 2021-12-11 DIAGNOSIS — U071 COVID-19: Secondary | ICD-10-CM | POA: Diagnosis not present

## 2021-12-11 DIAGNOSIS — R110 Nausea: Secondary | ICD-10-CM | POA: Diagnosis not present

## 2021-12-11 DIAGNOSIS — R42 Dizziness and giddiness: Secondary | ICD-10-CM | POA: Diagnosis not present

## 2021-12-11 DIAGNOSIS — J208 Acute bronchitis due to other specified organisms: Secondary | ICD-10-CM | POA: Diagnosis not present

## 2021-12-11 DIAGNOSIS — E860 Dehydration: Secondary | ICD-10-CM | POA: Diagnosis not present

## 2021-12-11 DIAGNOSIS — I10 Essential (primary) hypertension: Secondary | ICD-10-CM | POA: Diagnosis not present

## 2021-12-18 ENCOUNTER — Other Ambulatory Visit: Payer: Self-pay

## 2021-12-18 DIAGNOSIS — J208 Acute bronchitis due to other specified organisms: Secondary | ICD-10-CM | POA: Diagnosis not present

## 2021-12-18 DIAGNOSIS — R059 Cough, unspecified: Secondary | ICD-10-CM | POA: Diagnosis not present

## 2021-12-18 MED ORDER — DULOXETINE HCL 60 MG PO CPEP: 60.0000 mg | ORAL_CAPSULE | Freq: Every day | ORAL | 1 refills | Status: DC

## 2021-12-18 MED ORDER — DULOXETINE HCL 30 MG PO CPEP: 30.0000 mg | ORAL_CAPSULE | Freq: Every day | ORAL | 1 refills | Status: DC

## 2022-01-20 ENCOUNTER — Ambulatory Visit (INDEPENDENT_AMBULATORY_CARE_PROVIDER_SITE_OTHER): Payer: Medicare Other | Admitting: Internal Medicine

## 2022-01-20 ENCOUNTER — Encounter: Payer: Self-pay | Admitting: Internal Medicine

## 2022-01-20 VITALS — BP 122/68 | HR 89 | Temp 97.8°F | Resp 18 | Ht 59.0 in | Wt 212.4 lb

## 2022-01-20 DIAGNOSIS — J019 Acute sinusitis, unspecified: Secondary | ICD-10-CM | POA: Diagnosis not present

## 2022-01-20 MED ORDER — AMOXICILLIN 875 MG PO TABS: 875.0000 mg | ORAL_TABLET | Freq: Two times a day (BID) | ORAL | 0 refills | Status: DC

## 2022-01-20 NOTE — Assessment & Plan Note (Signed)
Sinusitis: ? Sx c/w sinusitis, currently with no bronchitis. ?Recommend amoxicillin, Flonase, Astepro.  See AVS. ?If not better in the next few days, she will let me know, round of prednisone?Marland Kitchen ?Abnormal SPEP: Per records review, reassess on RTC ?

## 2022-01-20 NOTE — Progress Notes (Signed)
? ?Subjective:  ? ? Patient ID: Madeline Daniel, female    DOB: [birth year], 81 y.o.   MRN: [id number] ? ?DOS:  01/20/2022 ?Type of visit - description: Acute ? ?Here with her daughter. ?6 weeks ago, went to ER in [place], she was diagnosed with bronchitis and dehydration. ?Got IV fluids, felt better. ?Was Rx Tessalon Perles, Zofran and a inhaler. ? ?She is here today because although the chest symptoms are better, she has ongoing sinus pain and congestion bilaterally. ? ?Denies fever, some chills (chronic for her) ?She is unable to blow any nasal discharge ?Has a dry cough. ?Not taking any OTCs consistently, some Mucinex in the last few days ? ?Review of Systems ?See above  ? ?Past Medical History:  ?Diagnosis Date  ? Anxiety   ? Arthritis   ? Atherosclerosis of aorta (HCC)   ? Barrett's esophagus   ? Bleeding ulcer 2005  ? CKD (chronic kidney disease), stage III (HCC)   ? Depression   ? Essential tremor   ? Gait disorder   ? GERD (gastroesophageal reflux disease)   ? History of rib fracture   ? multiple  ? History of stomach ulcers   ? Hyperlipidemia   ? Hypertension   ? Insomnia   ? Meniere disease   ? Nonalcoholic liver disease, chronic   ? Peptic ulcer disease   ? TIA (transient ischemic attack)   ? Vertigo   ? ? ?Past Surgical History:  ?Procedure Laterality Date  ? CATARACT EXTRACTION Bilateral   ? CHOLECYSTECTOMY    ? REPLACEMENT TOTAL KNEE BILATERAL Bilateral   ? ? ?Current Outpatient Medications  ?Medication Instructions  ? Albuterol Sulfate (PROAIR RESPICLICK) 108 (90 Base) MCG/ACT AEPB 2 puffs, Inhalation, Every 6 hours PRN  ? alendronate (FOSAMAX) 70 mg, Oral, Weekly, Take with a full glass of water on an empty stomach. Remain sitting upright for 45-60 mins  ? ALPRAZolam (XANAX) 0.25-0.5 mg, Oral, Daily PRN  ? amLODipine (NORVASC) 5 mg, Oral, Daily  ? amoxicillin (AMOXIL) 875 mg, Oral, 2 times daily  ? atorvastatin (LIPITOR) 40 mg, Oral, Daily  ? DULoxetine (CYMBALTA) 30 mg, Oral, Daily, Take with 60mg   capsule to equal 90mg  daily  ? DULoxetine (CYMBALTA) 60 mg, Oral, Daily, Take with 30mg  capsule to equal 90 mg daily  ? folic acid (FOLVITE) 1 mg, Oral, Daily  ? hydrochlorothiazide (HYDRODIURIL) 12.5 mg, Oral, Daily  ? lisinopril (ZESTRIL) 40 mg, Oral, Daily  ? metoprolol succinate (TOPROL-XL) 25 mg, Oral, Daily, Take with or immediately following a meal.  ? naloxone (NARCAN) nasal spray 4 mg/0.1 mL 1 spray, Nasal, As directed  ? omeprazole (PRILOSEC) 20 mg, Oral, 2 times daily before meals  ? ondansetron (ZOFRAN) 4 mg, Oral, Every 8 hours PRN  ? primidone (MYSOLINE) 100 mg, Oral, Daily at bedtime  ? traMADol (ULTRAM) 50 mg, Oral, Daily PRN  ? Vitamin D (Ergocalciferol) (DRISDOL) 50,000 Units, Oral, Every 7 days  ? ? ?   ?Objective:  ? Physical Exam ?BP 122/68 (BP Location: Left Arm, Patient Position: Sitting, Cuff Size: Small)   Pulse 89   Temp 97.8 ?F (36.6 ?C) (Oral)   Resp 18   Ht 4\' 11"  (1.499 m)   Wt 212 lb 6 oz (96.3 kg)   SpO2 97%   BMI 42.89 kg/m?  ?General:   ?Well developed, NAD, BMI noted. ?HEENT:  ?Normocephalic . Face symmetric, atraumatic. ?TMs: Normal ?Nose: Not congested ?Sinuses: Moderately TTP at the left maxillary area. ?Throat: Symmetric, not  red ?Lungs:  ?CTA B ?Normal respiratory effort, no intercostal retractions, no accessory muscle use. ?Heart: RRR,  no murmur.  ?Lower extremities: no pretibial edema bilaterally  ?Skin: Not pale. Not jaundice ?Neurologic:  ?alert & oriented X3.  ?Speech normal, gait: Assisted by walker  ?psych--  ?Cognition and judgment appear intact.  ?Cooperative with normal attention span and concentration.  ?Behavior appropriate. ?No anxious or depressed appearing.  ? ?   ?Assessment   ? ?  Assessment (new patient, referred by her daughter [name]) ?Hyperglycemia ?HTN ?High cholesterol ?Anxiety depression ?GI: ?-GERD, h/o esophageal dilatations , Barrett's  (see GI note 03/26/2020) ?-EGD:  04/02/2020: Normal duodenum, suggestion of Barrett's esophagus, hiatal  hernia (dilatation), polyp stomach. Pathology not available ?-Question of liver cirrhosis, incidental finding on CTA 11/05/2019. 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 ?-H./o TIA  ?-Meni?re, lack of balance, zofran prn ?Bronchospasm  ?DJD knee-hands-neck , tramadol prn ?Gait d/o: due to dizziness >> DJD; uses a walker, h/o frequent fall ?Social: Move with her daughter [name] from [place], plans to live in [place] during the winters ? ? ?PLAN:  ?Sinusitis: ? Sx c/w sinusitis, currently with no bronchitis. ?Recommend amoxicillin, Flonase, Astepro.  See AVS. ?If not better in the next few days, she will let me know, round of prednisone?Marland Kitchen ?Abnormal SPEP: Per records review, reassess on RTC ? ? ?This visit occurred during the SARS-CoV-2 public health emergency.  Safety protocols were in place, including screening questions prior to the visit, additional usage of staff PPE, and extensive cleaning of exam room while observing appropriate contact time as indicated for disinfecting solutions.  ? ?

## 2022-01-20 NOTE — Patient Instructions (Signed)
?  Rest, fluids , tylenol ? ?For cough:  ?Take Mucinex DM or Robitussin-DM OTC.  Follow the instructions in the box. ? ? ?For nasal congestion: ?-Use over-the-counter Flonase: 2 nasal sprays on each side of the nose in the morning until you feel better ? ?-Use OTC Astepro 2 nasal sprays on each side of the nose twice daily until better ? ?Avoid decongestants such as  Pseudoephedrine or phenylephrine  ? ? ?Take the antibiotic as prescribed   ? ?Call if not gradually better over the next  10 days ? ?  ?

## 2022-01-23 DIAGNOSIS — G5603 Carpal tunnel syndrome, bilateral upper limbs: Secondary | ICD-10-CM | POA: Diagnosis not present

## 2022-02-02 DIAGNOSIS — G5603 Carpal tunnel syndrome, bilateral upper limbs: Secondary | ICD-10-CM | POA: Diagnosis not present

## 2022-02-02 DIAGNOSIS — M542 Cervicalgia: Secondary | ICD-10-CM | POA: Diagnosis not present

## 2022-02-03 ENCOUNTER — Encounter: Payer: Self-pay | Admitting: Internal Medicine

## 2022-02-06 DIAGNOSIS — G5601 Carpal tunnel syndrome, right upper limb: Secondary | ICD-10-CM | POA: Diagnosis not present

## 2022-02-06 HISTORY — PX: CARPAL TUNNEL RELEASE: SHX101

## 2022-02-09 ENCOUNTER — Encounter: Payer: Self-pay | Admitting: Internal Medicine

## 2022-02-10 ENCOUNTER — Telehealth (INDEPENDENT_AMBULATORY_CARE_PROVIDER_SITE_OTHER): Payer: Medicare Other | Admitting: Internal Medicine

## 2022-02-10 ENCOUNTER — Encounter: Payer: Self-pay | Admitting: Internal Medicine

## 2022-02-10 VITALS — Ht 59.0 in | Wt 212.0 lb

## 2022-02-10 DIAGNOSIS — J9801 Acute bronchospasm: Secondary | ICD-10-CM | POA: Diagnosis not present

## 2022-02-10 MED ORDER — ALBUTEROL SULFATE 108 (90 BASE) MCG/ACT IN AEPB: 2.0000 | INHALATION_SPRAY | Freq: Four times a day (QID) | RESPIRATORY_TRACT | 5 refills | Status: DC | PRN

## 2022-02-10 NOTE — Progress Notes (Signed)
? ?Subjective:  ? ? Patient ID: Madeline Daniel, female    DOB: 02/27/1941, 81 y.o.   MRN: [id number] ? ?DOS:  02/10/2022 ?Type of visit - description: Virtual Visit via Video Note ? ?I connected with the above patient  by a video enabled telemedicine application and verified that I am speaking with the correct person using two identifiers. ?  ?THIS ENCOUNTER IS A VIRTUAL VISIT DUE TO COVID-19 - PATIENT WAS NOT SEEN IN THE OFFICE. PATIENT HAS CONSENTED TO VIRTUAL VISIT / TELEMEDICINE VISIT ?  ?Location of patient: home ? ?Location of provider: office ? ?Persons participating in the virtual visit: patient, provider  ? ?I discussed the limitations of evaluation and management by telemedicine and the availability of in person appointments. The patient expressed understanding and agreed to proceed. ? ?Acute: ?The patient had carpal tunnel syndrome surgery 4 days ago, she was given local anesthesia and apparently sedation but no general anesthesia. ?Shortly after the procedure she started with respiratory symptoms including wheezing, chest congestion, some cough. ?DOE much higher than baseline. ?No fever chills ?Her daughter is a Engineer, civil (consulting), she felt some crackling sounds at the bases. ?No lower extremity edema, no GERD type of symptoms, no chest pain ?They started to do respiratory exercises. ? ?Today, the patient and the daughter reported that things are much better compared to last night. ?Decreased cough, decreased chest congestion on exam, DOE back to baseline. ?Wheezing much less. ? ? ?Review of Systems ?See above  ? ?Past Medical History:  ?Diagnosis Date  ? Anxiety   ? Arthritis   ? Atherosclerosis of aorta (HCC)   ? Barrett's esophagus   ? Bleeding ulcer 2005  ? CKD (chronic kidney disease), stage III (HCC)   ? Depression   ? Essential tremor   ? Gait disorder   ? GERD (gastroesophageal reflux disease)   ? History of rib fracture   ? multiple  ? History of stomach ulcers   ? Hyperlipidemia   ? Hypertension   ?  Insomnia   ? Meniere disease   ? Nonalcoholic liver disease, chronic   ? Peptic ulcer disease   ? TIA (transient ischemic attack)   ? Vertigo   ? ? ?Past Surgical History:  ?Procedure Laterality Date  ? CARPAL TUNNEL RELEASE Right 02/06/2022  ? CATARACT EXTRACTION Bilateral   ? CHOLECYSTECTOMY    ? REPLACEMENT TOTAL KNEE BILATERAL Bilateral   ? ? ?Current Outpatient Medications  ?Medication Instructions  ? Albuterol Sulfate (PROAIR RESPICLICK) 108 (90 Base) MCG/ACT AEPB 2 puffs, Inhalation, Every 6 hours PRN  ? alendronate (FOSAMAX) 70 mg, Oral, Weekly, Take with a full glass of water on an empty stomach. Remain sitting upright for 45-60 mins  ? ALPRAZolam (XANAX) 0.25-0.5 mg, Oral, Daily PRN  ? amLODipine (NORVASC) 5 mg, Oral, Daily  ? atorvastatin (LIPITOR) 40 mg, Oral, Daily  ? DULoxetine (CYMBALTA) 30 mg, Oral, Daily, Take with 60mg  capsule to equal 90mg  daily  ? DULoxetine (CYMBALTA) 60 mg, Oral, Daily, Take with 30mg  capsule to equal 90 mg daily  ? folic acid (FOLVITE) 1 mg, Oral, Daily  ? hydrochlorothiazide (HYDRODIURIL) 12.5 mg, Oral, Daily  ? lisinopril (ZESTRIL) 40 mg, Oral, Daily  ? metoprolol succinate (TOPROL-XL) 25 mg, Oral, Daily, Take with or immediately following a meal.  ? naloxone (NARCAN) nasal spray 4 mg/0.1 mL 1 spray  ? omeprazole (PRILOSEC) 20 mg, Oral, 2 times daily before meals  ? ondansetron (ZOFRAN) 4 mg, Oral, Every 8 hours PRN  ?  oxyCODONE-acetaminophen (PERCOCET) 10-325 MG tablet 0.5 tablets, Every 6 hours  ? primidone (MYSOLINE) 100 mg, Oral, Daily at bedtime  ? traMADol (ULTRAM) 50 mg, Oral, Daily PRN  ? Vitamin D (Ergocalciferol) (DRISDOL) 50,000 Units, Oral, Every 7 days  ? ? ?   ?Objective:  ? Physical Exam ?Ht 4\' 11"  (1.499 m)   Wt 212 lb (96.2 kg)   BMI 42.82 kg/m?  ?This is a virtual video visit, the daughter also participate, the patient looks well, sitting on a sofa, in no distress, speaking in complete sentences, no chest congestion or wheezing noted. ?   ?Assessment    ? ?  ?  Assessment (new patient, referred by her daughter ) ?Hyperglycemia ?HTN ?High cholesterol ?Anxiety depression ?GI: ?-GERD, h/o esophageal dilatations , Barrett's  (see GI note 03/26/2020) ?-EGD:  04/02/2020: Normal duodenum, suggestion of Barrett's esophagus, hiatal hernia (dilatation), polyp stomach. Pathology not available ?-Question of liver cirrhosis, incidental finding on CTA 11/05/2019. ?NEURO ?-Essential Tremor, onset age 77s  on primidone ?-Brain MRIvolume loss and small vessel ischemic disease. ?-H./o TIA  ?-Meni?re, lack of balance, zofran prn ?Bronchospasm  ?DJD knee-hands-neck , tramadol prn ?Gait d/o: due to dizziness >> DJD; uses a walker, h/o frequent fall ?Social: Move with her daughter 49s from [name], plans to live in [place] during the winters ? ? ?PLAN:  ?Bronchospasm: ?Patient developed bronchospasm shortly after she was sedated for a carpal tunnel syndrome surgery. ?At some point she was really congested and not looking well however today is much improved. ?Suspect she had an asthma exacerbation versus perhaps mild aspiration given the recent sedation. ?I recommend them to continue doing the lung exercises and try to move around the house to prevent atelectasis. ?Refill albuterol to use as needed ?If the improvement does not continue, she could have a round of prednisone or antibiotics. ?The daughter and the patient agreed with above. ?They will keep me posted. ? ? ?I discussed the assessment and treatment plan with the patient. The patient was provided an opportunity to ask questions and all were answered. The patient agreed with the plan and demonstrated an understanding of the instructions. ?  ?The patient was advised to call back or seek an in-person evaluation if the symptoms worsen or if the condition fails to improve as anticipated. ?  ?

## 2022-02-11 NOTE — Assessment & Plan Note (Signed)
Bronchospasm: ?Patient developed bronchospasm shortly after she was sedated for a carpal tunnel syndrome surgery. ?At some point she was really congested and not looking well however today is much improved. ?Suspect she had an asthma exacerbation versus perhaps mild aspiration given the recent sedation. ?I recommend them to continue doing the lung exercises and try to move around the house to prevent atelectasis. ?Refill albuterol to use as needed ?If the improvement does not continue, she could have a round of prednisone or antibiotics. ?The daughter and the patient agreed with above. ?They will keep me posted. ? ?

## 2022-02-12 ENCOUNTER — Ambulatory Visit (INDEPENDENT_AMBULATORY_CARE_PROVIDER_SITE_OTHER): Payer: Medicare Other

## 2022-02-12 VITALS — Ht 59.0 in | Wt 212.0 lb

## 2022-02-12 DIAGNOSIS — Z0000 Encounter for general adult medical examination without abnormal findings: Secondary | ICD-10-CM

## 2022-02-12 NOTE — Progress Notes (Addendum)
? ?Subjective:  ? Madeline Daniel is a 81 y.o. female who presents for an Initial Medicare Annual Wellness Visit. ?Virtual Visit via Telephone Note ? ?I connected with  Madeline Daniel on 02/12/22 at  3:00 PM EDT by telephone and verified that I am speaking with the correct person using two identifiers. ? ?Location: ?Patient: HOME ?Provider: [facility] ?Persons participating in the virtual visit: patient/Nurse Health Advisor ?  ?I discussed the limitations, risks, security and privacy concerns of performing an evaluation and management service by telephone and the availability of in person appointments. The patient expressed understanding and agreed to proceed. ? ?Interactive audio and video telecommunications were attempted between this nurse and patient, however failed, due to patient having technical difficulties OR patient did not have access to video capability.  We continued and completed visit with audio only. ? ?Some vital signs may be absent or patient reported.  ? ?[name], LPN ? ?Review of Systems    ? ?Cardiac Risk Factors include: advanced age (>6men, >51 women);hypertension;dyslipidemia;sedentary lifestyle;obesity (BMI >30kg/m2) ? ?   ?Objective:  ?  ?Today's Vitals  ? 02/12/22 1455 02/12/22 1456  ?Weight: 212 lb (96.2 kg)   ?Height: 4\' 11"  (1.499 m)   ?PainSc:  2   ? ?Body mass index is 42.82 kg/m?. ? ? ?  02/12/2022  ?  3:06 PM  ?Advanced Directives  ?Does Patient Have a Medical Advance Directive? No  ?Would patient like information on creating a medical advance directive? No - Patient declined  ? ? ?Current Medications (verified) ?Outpatient Encounter Medications as of 02/12/2022  ?Medication Sig  ? Albuterol Sulfate (PROAIR RESPICLICK) 123XX123 (90 Base) MCG/ACT AEPB Inhale 2 puffs into the lungs every 6 (six) hours as needed.  ? alendronate (FOSAMAX) 70 MG tablet Take 1 tablet (70 mg total) by mouth once a week. Take with a full glass of water on an empty stomach. Remain sitting upright for  45-60 mins  ? ALPRAZolam (XANAX) 0.25 MG tablet Take 1-2 tablets (0.25-0.5 mg total) by mouth daily as needed for anxiety.  ? amLODipine (NORVASC) 5 MG tablet Take 5 mg by mouth daily.  ? atorvastatin (LIPITOR) 40 MG tablet Take 40 mg by mouth daily.  ? DULoxetine (CYMBALTA) 30 MG capsule Take 1 capsule (30 mg total) by mouth daily. Take with 60mg  capsule to equal 90mg  daily  ? DULoxetine (CYMBALTA) 60 MG capsule Take 1 capsule (60 mg total) by mouth daily. Take with 30mg  capsule to equal 90 mg daily  ? folic acid (FOLVITE) 0.5 MG tablet Take 1 mg by mouth daily.  ? hydrochlorothiazide (HYDRODIURIL) 12.5 MG tablet Take 12.5 mg by mouth daily.  ? lisinopril (ZESTRIL) 40 MG tablet Take 40 mg by mouth daily.  ? metoprolol succinate (TOPROL-XL) 25 MG 24 hr tablet Take 1 tablet (25 mg total) by mouth daily. Take with or immediately following a meal.  ? naloxone (NARCAN) nasal spray 4 mg/0.1 mL Place 1 spray into the nose. As directed (Patient not taking: Reported on 02/10/2022)  ? omeprazole (PRILOSEC) 20 MG capsule Take 20 mg by mouth 2 (two) times daily before a meal.  ? ondansetron (ZOFRAN) 4 MG tablet Take 1 tablet (4 mg total) by mouth every 8 (eight) hours as needed for nausea or vomiting. (Patient not taking: Reported on 02/10/2022)  ? oxyCODONE-acetaminophen (PERCOCET) 10-325 MG tablet Percocet 10 mg-325 mg tablet ? Take 0.5 tablets every 6 hours by oral route. (Patient not taking: Reported on 02/12/2022)  ?  primidone (MYSOLINE) 50 MG tablet Take 100 mg by mouth at bedtime.  ? traMADol (ULTRAM) 50 MG tablet Take 1 tablet (50 mg total) by mouth daily as needed. (Patient not taking: Reported on 02/10/2022)  ? traMADol (ULTRAM) 50 MG tablet tramadol 50 mg tablet ? Take 1 tablet every 6 hours by oral route.  ? Vitamin D, Ergocalciferol, (DRISDOL) 1.25 MG (50000 UNIT) CAPS capsule Take 1 capsule (50,000 Units total) by mouth every 7 (seven) days. (Patient not taking: Reported on 02/10/2022)  ? [DISCONTINUED]  oxyCODONE-acetaminophen (PERCOCET) 10-325 MG tablet Take 0.5 tablets by mouth every 6 (six) hours. (Patient not taking: Reported on 02/10/2022)  ? ?No facility-administered encounter medications on file as of 02/12/2022.  ? ? ?Allergies (verified) ?Patient has no known allergies.  ? ?History: ?Past Medical History:  ?Diagnosis Date  ? Anxiety   ? Arthritis   ? Atherosclerosis of aorta ([place])   ? Barrett's esophagus   ? Bleeding ulcer 2005  ? CKD (chronic kidney disease), stage III ([place])   ? Depression   ? Essential tremor   ? Gait disorder   ? GERD (gastroesophageal reflux disease)   ? History of rib fracture   ? multiple  ? History of stomach ulcers   ? Hyperlipidemia   ? Hypertension   ? Insomnia   ? Meniere disease   ? Nonalcoholic liver disease, chronic   ? Peptic ulcer disease   ? TIA (transient ischemic attack)   ? Vertigo   ? ?Past Surgical History:  ?Procedure Laterality Date  ? CARPAL TUNNEL RELEASE Right 02/06/2022  ? CATARACT EXTRACTION Bilateral   ? CHOLECYSTECTOMY    ? REPLACEMENT TOTAL KNEE BILATERAL Bilateral   ? ?Family History  ?Problem Relation Age of Onset  ? Hypertension Mother   ? Diabetes Father   ? CAD Father   ?     lives till 22 y/o  ? Throat cancer Father   ? Lung cancer Father   ? Heart failure Father   ? Hypertension Father   ? Heart attack Father   ? Colon cancer Brother   ?     8s  ? Leukemia Paternal Grandfather   ? Breast cancer Neg Hx   ? ?Social History  ? ?Socioeconomic History  ? Marital status: Widowed  ?  Spouse name: Not on file  ? Number of children: 3  ? Years of education: Not on file  ? Highest education level: Not on file  ?Occupational History  ? Occupation: retired- Press photographer  ?Tobacco Use  ? Smoking status: Never  ? Smokeless tobacco: Never  ?Substance and Sexual Activity  ? Alcohol use: Yes  ?  Comment: 2-3 glasses  ? Drug use: Not on file  ? Sexual activity: Not on file  ?Other Topics Concern  ? Not on file  ?Social History Narrative  ? Moved to live part  time in [place]  2022  ? Lives w/ daughter Madeline Daniel  ? ?Social Determinants of Health  ? ?Financial Resource Strain: Low Risk   ? Difficulty of Paying Living Expenses: Not hard at all  ?Food Insecurity: No Food Insecurity  ? Worried About Charity fundraiser in the Last Year: Never true  ? Ran Out of Food in the Last Year: Never true  ?Transportation Needs: Unknown  ? Lack of Transportation (Medical): Not on file  ? Lack of Transportation (Non-Medical): No  ?Physical Activity: Insufficiently Active  ? Days of Exercise per Week: 3 days  ? Minutes of  Exercise per Session: 20 min  ?Stress: No Stress Concern Present  ? Feeling of Stress : Not at all  ?Social Connections: Moderately Isolated  ? Frequency of Communication with Friends and Family: More than three times a week  ? Frequency of Social Gatherings with Friends and Family: More than three times a week  ? Attends Religious Services: 1 to 4 times per year  ? Active Member of Clubs or Organizations: No  ? Attends Archivist Meetings: Never  ? Marital Status: Widowed  ? ? ?Tobacco Counseling ?Counseling given: Not Answered ? ? ?Clinical Intake: ? ?Pre-visit preparation completed: Yes ? ?Pain : 0-10 ?Pain Score: 2  ?Pain Type: Acute pain ?Pain Location: Wrist ?Pain Descriptors / Indicators: Dull, Aching ?Pain Onset: In the past 7 days ?Pain Frequency: Intermittent ? ?  ? ?BMI - recorded: 42.82 ?Nutritional Status: BMI > 30  Obese ?Nutritional Risks: None ?Diabetes: No ? ?How often do you need to have someone help you when you read instructions, pamphlets, or other written materials from your doctor or pharmacy?: 1 - Never ? ?Diabetic?NO ? ?Interpreter Needed?: No ? ?Information entered by :: mj , lpn ? ? ?Activities of Daily Living ? ?  02/12/2022  ?  3:08 PM 07/01/2021  ?  2:43 PM  ?In your present state of health, do you have any difficulty performing the following activities:  ?Hearing? 0 0  ?Vision? 0 0  ?Difficulty concentrating or making decisions?  0  ?Walking or  climbing stairs? 0 1  ?Dressing or bathing? 0 0  ?Doing errands, shopping? 0 1  ?Preparing Food and eating ? N   ?Using the Toilet? N   ?In the past six months, have you accidently leaked urine? Y   ?Do you have proble

## 2022-02-12 NOTE — Patient Instructions (Signed)
Madeline Daniel , ?Thank you for taking time to come for your Medicare Wellness Visit. I appreciate your ongoing commitment to your health goals. Please review the following plan we discussed and let me know if I can assist you in the future.  ? ?Screening recommendations/referrals: ?Colonoscopy: No longer required due to age.  ?Mammogram: No longer required due to age.  ?Bone Density: Done 09/08/2021 Repeat every 2 years ? ?Recommended yearly ophthalmology/optometry visit for glaucoma screening and checkup ?Recommended yearly dental visit for hygiene and checkup ? ?Vaccinations: ?Influenza vaccine: Done 09/11/2021 Repeat annually ? ?Pneumococcal vaccine: Done 12/02/2020 ?Tdap vaccine: Done 05/15/2021 Repeat in 10 years ? ?Shingles vaccine: Discussed.   ?Covid-19:Done 11/15/2019, 12/13/2019, 08/26/2020, 02/13/2021 and 09/08/2021 ? ?Advanced directives: Please bring a copy of your health care power of attorney and living will to the office to be added to your chart at your convenience. ? ? ?Conditions/risks identified: Aim for 30 minutes of exercise, 6-8 glasses of water, and 5 servings of fruits and vegetables each day. ? ? ?Next appointment: Follow up in one year for your annual wellness visit 2024. ? ? ?Preventive Care 54 Years and Older, Female ?Preventive care refers to lifestyle choices and visits with your health care provider that can promote health and wellness. ?What does preventive care include? ?A yearly physical exam. This is also called an annual well check. ?Dental exams once or twice a year. ?Routine eye exams. Ask your health care provider how often you should have your eyes checked. ?Personal lifestyle choices, including: ?Daily care of your teeth and gums. ?Regular physical activity. ?Eating a healthy diet. ?Avoiding tobacco and drug use. ?Limiting alcohol use. ?Practicing safe sex. ?Taking low-dose aspirin every day. ?Taking vitamin and mineral supplements as recommended by your health care provider. ?What  happens during an annual well check? ?The services and screenings done by your health care provider during your annual well check will depend on your age, overall health, lifestyle risk factors, and family history of disease. ?Counseling  ?Your health care provider may ask you questions about your: ?Alcohol use. ?Tobacco use. ?Drug use. ?Emotional well-being. ?Home and relationship well-being. ?Sexual activity. ?Eating habits. ?History of falls. ?Memory and ability to understand (cognition). ?Work and work Astronomer. ?Reproductive health. ?Screening  ?You may have the following tests or measurements: ?Height, weight, and BMI. ?Blood pressure. ?Lipid and cholesterol levels. These may be checked every 5 years, or more frequently if you are over 7 years old. ?Skin check. ?Lung cancer screening. You may have this screening every year starting at age 18 if you have a 30-pack-year history of smoking and currently smoke or have quit within the past 15 years. ?Fecal occult blood test (FOBT) of the stool. You may have this test every year starting at age 69. ?Flexible sigmoidoscopy or colonoscopy. You may have a sigmoidoscopy every 5 years or a colonoscopy every 10 years starting at age 15. ?Hepatitis C blood test. ?Hepatitis B blood test. ?Sexually transmitted disease (STD) testing. ?Diabetes screening. This is done by checking your blood sugar (glucose) after you have not eaten for a while (fasting). You may have this done every 1-3 years. ?Bone density scan. This is done to screen for osteoporosis. You may have this done starting at age 40. ?Mammogram. This may be done every 1-2 years. Talk to your health care provider about how often you should have regular mammograms. ?Talk with your health care provider about your test results, treatment options, and if necessary, the need for more tests. ?  Vaccines  ?Your health care provider may recommend certain vaccines, such as: ?Influenza vaccine. This is recommended every  year. ?Tetanus, diphtheria, and acellular pertussis (Tdap, Td) vaccine. You may need a Td booster every 10 years. ?Zoster vaccine. You may need this after age 52. ?Pneumococcal 13-valent conjugate (PCV13) vaccine. One dose is recommended after age 25. ?Pneumococcal polysaccharide (PPSV23) vaccine. One dose is recommended after age 74. ?Talk to your health care provider about which screenings and vaccines you need and how often you need them. ?This information is not intended to replace advice given to you by your health care provider. Make sure you discuss any questions you have with your health care provider. ?Document Released: 11/08/2015 Document Revised: 07/01/2016 Document Reviewed: 08/13/2015 ?Elsevier Interactive Patient Education ? 2017 [place]. ? ?Fall Prevention in the Home ?Falls can cause injuries. They can happen to people of all ages. There are many things you can do to make your home safe and to help prevent falls. ?What can I do on the outside of my home? ?Regularly fix the edges of walkways and driveways and fix any cracks. ?Remove anything that might make you trip as you walk through a door, such as a raised step or threshold. ?Trim any bushes or trees on the path to your home. ?Use bright outdoor lighting. ?Clear any walking paths of anything that might make someone trip, such as rocks or tools. ?Regularly check to see if handrails are loose or broken. Make sure that both sides of any steps have handrails. ?Any raised decks and porches should have guardrails on the edges. ?Have any leaves, snow, or ice cleared regularly. ?Use sand or salt on walking paths during winter. ?Clean up any spills in your garage right away. This includes oil or grease spills. ?What can I do in the bathroom? ?Use night lights. ?Install grab bars by the toilet and in the tub and shower. Do not use towel bars as grab bars. ?Use non-skid mats or decals in the tub or shower. ?If you need to sit down in the shower, use a  plastic, non-slip stool. ?Keep the floor dry. Clean up any water that spills on the floor as soon as it happens. ?Remove soap buildup in the tub or shower regularly. ?Attach bath mats securely with double-sided non-slip rug tape. ?Do not have throw rugs and other things on the floor that can make you trip. ?What can I do in the bedroom? ?Use night lights. ?Make sure that you have a light by your bed that is easy to reach. ?Do not use any sheets or blankets that are too big for your bed. They should not hang down onto the floor. ?Have a firm chair that has side arms. You can use this for support while you get dressed. ?Do not have throw rugs and other things on the floor that can make you trip. ?What can I do in the kitchen? ?Clean up any spills right away. ?Avoid walking on wet floors. ?Keep items that you use a lot in easy-to-reach places. ?If you need to reach something above you, use a strong step stool that has a grab bar. ?Keep electrical cords out of the way. ?Do not use floor polish or wax that makes floors slippery. If you must use wax, use non-skid floor wax. ?Do not have throw rugs and other things on the floor that can make you trip. ?What can I do with my stairs? ?Do not leave any items on the stairs. ?Make sure that  there are handrails on both sides of the stairs and use them. Fix handrails that are broken or loose. Make sure that handrails are as long as the stairways. ?Check any carpeting to make sure that it is firmly attached to the stairs. Fix any carpet that is loose or worn. ?Avoid having throw rugs at the top or bottom of the stairs. If you do have throw rugs, attach them to the floor with carpet tape. ?Make sure that you have a light switch at the top of the stairs and the bottom of the stairs. If you do not have them, ask someone to add them for you. ?What else can I do to help prevent falls? ?Wear shoes that: ?Do not have high heels. ?Have rubber bottoms. ?Are comfortable and fit you  well. ?Are closed at the toe. Do not wear sandals. ?If you use a stepladder: ?Make sure that it is fully opened. Do not climb a closed stepladder. ?Make sure that both sides of the stepladder are locked into place.

## 2022-02-19 ENCOUNTER — Ambulatory Visit (INDEPENDENT_AMBULATORY_CARE_PROVIDER_SITE_OTHER): Payer: Medicare Other | Admitting: Pharmacist

## 2022-02-19 DIAGNOSIS — I700 Atherosclerosis of aorta: Secondary | ICD-10-CM

## 2022-02-19 DIAGNOSIS — G250 Essential tremor: Secondary | ICD-10-CM

## 2022-02-19 DIAGNOSIS — M810 Age-related osteoporosis without current pathological fracture: Secondary | ICD-10-CM

## 2022-02-19 DIAGNOSIS — G894 Chronic pain syndrome: Secondary | ICD-10-CM

## 2022-02-19 DIAGNOSIS — E785 Hyperlipidemia, unspecified: Secondary | ICD-10-CM

## 2022-02-19 DIAGNOSIS — I10 Essential (primary) hypertension: Secondary | ICD-10-CM

## 2022-02-19 MED ORDER — VITAMIN D3 50 MCG (2000 UT) PO CAPS: 2000.0000 [IU] | ORAL_CAPSULE | Freq: Every day | ORAL | 0 refills | Status: AC

## 2022-02-19 MED ORDER — ALENDRONATE SODIUM 70 MG PO TABS: 70.0000 mg | ORAL_TABLET | ORAL | 3 refills | Status: AC

## 2022-02-19 NOTE — Patient Instructions (Signed)
Madeline Daniel ?It was a pleasure speaking with you  ?Below is a summary of your health goals and care plan ? ? ?Patient Goals/Self-Care Activities ?take medications as prescribed,  ?focus on medication adherence by filling medications on time,  ?check blood pressure 1 to 2 times per week, document, and provide at future appointments,  ?engage in dietary modifications by limiting intake of fried foods and saturated fat ?consider aquatic physical therapy after recent surgery healed.  ?Restart alendraonte 70mg  weekly ?Start vitamin D over-the-counter 2000 units daily  ?You can try lower dose of primidone of 50mg  at bedtime; if tremors worsen then restart 100mg  at bedtime ? ? ?If you have any questions or concerns, please feel free to contact me either at the phone number below or with a MyChart message.  ? ?Keep up the good work! ? ?Cherre Robins, PharmD ?Clinical Pharmacist ?[facility] ?[place] ?[phone] (direct line)  ?[phone] (main office number) ? ? ?Chronic Pain Syndrome:  ?Goal: decrease pain and improve activity ?Currently therapy:  ?Tramadol 50mg  once a day as needed ?Duloxetine 30mg  + 60mg  capules daily to equal a total daily dose of 90mg  ?Interventions:  ?Continue duloxetine each morning  ?Continue to follow up with Dr Nelva Bush and Levy Pupa, NP ?Discussed aquatic physical therapy.  ? ?Essential Tremor:  ?Goal: decrease symptoms of essential tremor  ?Currently therapy:  ?Primidone 50mg  - take 2 tablets = 100mg  at bedtime.  ?Interventions:  ?You can try lower dose of primidone of 50mg  at bedtime; if tremors worsen then restart 100mg  at bedtime ? ?Osteoporosis / Low Vitamin D:  ?Goal: Prevent fractures and achieve therapeutic levels of serum vitamin D.  ? ?Last vitamin D ?Lab Results  ?Component Value Date  ? VD25OH 8.13 (L) 11/03/2021  ? ?Current therapy:  ?Alendronate 70mg  - take 1 tablet once a week on an empty stomach, prior to eating or taking other medications; Take with a  full glass (at least 4 ounces of water) Do not eat, drink, lie down or recline for 30 minutes after taking alendronate.  ?Vitamin D 50,000 units weekly - started 11/06/2021 ?Interventions:  ?Will try to minimize medications that can increase risk of fall  ?Restart alendraonte 70mg  weekly ?Start vitamin D over-the-counter 2000 units daily  ? ?Hypertension: ?Controlled; blood pressure goal 140/90 ?BP Readings from Last 3 Encounters:  ?01/20/22 122/68  ?11/03/21 134/84  ?09/01/21 (!) 144/72  ? ?Current treatment: ?Lisinopril 40mg  daily ?Hydrochlorothiazide 25mg  daily  ?Amlodipine 5mg  daily  ?Metoprolol succinate ER 25mg  daily   ?Interventions:  ?Discussed blood pressure goal ?Recommended continue current therapy ?Continue to check blood pressure 1 to 2 times per week and record ? ?Hyperlipidemia: ?Uncontrolled; LDL goal <70 (due to atherosclerosis of aorta) ?Lab Results  ?Component Value Date  ? [place] 71 11/08/2020  ? ?current treatment: ?Atorvastatin 40mg  daily  ?Current dietary patterns:  tries to limit high fat and fried foods ?Interventions:  ?Continue to take atorvastatin 40mg  daily ?Very close to LDL goal - continue to limit saturated fat intake.  ? ?Medication management ?Pharmacist Clinical Goal(s): ?Over the next 90 days, patient will work with PharmD and providers to maintain optimal medication adherence ?Current pharmacy: [facility] ?Interventions ?Comprehensive medication review performed. ?Reviewed refill history and assessed adherence  ?Continue current medication management strategy ? ? ? ?Follow Up Plan: Telephone follow up appointment with care management team member scheduled for:  3 months  ? ?Patient verbalizes understanding of instructions and care plan provided today and agrees  to view in [place]. Active MyChart status confirmed with patient.    ?

## 2022-02-19 NOTE — Chronic Care Management (AMB) (Signed)
? ? ?Chronic Care Management ?Pharmacy Note ? ?02/19/2022 ?Name:  Madeline Daniel MRN:  [id number] DOB:  12-20-1940 ? ?Summary: ?Reviewed refill history and adherence. Noted she has not filled alendronate since January. Patient was not sure if she was to continue after 12 weeks. Education provided and Rx sent to [facility]. Also discussed vitamin D supplementation. She has completed Rx Vitamin D 50,000units weekly but has not started over-the-counter vitamin D yet. Advised to take vtiamin D 2000 IU daily.   ?Patient would like to try taking less primidone if possible. She denies any side effects but would just like to try taking less. Recommended she can lower dose from 184m at bedtime to take 571mat bedtime and monitor for increase in tremor. She has follow up with PCP 03/03/2022.  ?Patient just had carpel tunnel surgery 02/06/2022. At our last visit we had discussed [place] PT [place] Will hold off until after healed from surgery.  ? ? ?Plan: PCP appointment 03/03/2022 - CPE, recheck vitamin D; Clinical Pharmacist Practitioner in 3 months.  ? ?Subjective: ?SuMADDY Daniel an [age].o. year old female who is a primary patient of Paz, JoAlda BertholdMD.  The CCM team was consulted for assistance with disease management and care coordination needs.   ? ?Engaged with patient by telephone for follow up visit in response to provider referral for pharmacy case management and/or care coordination services.  ? ?Consent to Services:  ?The patient was given information about Chronic Care Management services, agreed to services, and gave verbal consent prior to initiation of services.  Please see initial visit note for detailed documentation.  ? ?Patient Care Team: ?PaColon BranchMD as PCP - General (Internal Medicine) ?EcCherre RobinsRPH-CPP (Pharmacist) ? ?Recent Office Visits:  ?02/10/2022 - Int Med (Dr Paz)Video Visit for  Bronchospasm.  Recent surgery for carpel tunnel.  Refilled albuterol inhaler. If  not better, call office and will prescribe prednisone ?01/20/2022 - Int Med (Dr PaLarose KellsSeen for acute sinusitis. Prescribed amoxicillin [id number] a day. Also recommended OTC Flonsae, Astepro nasal sprays. ? ?Recent Consults:  ?02/02/2022 - Ortho Surgery (Dr SpeGreta Doomeen for neck pain and carpel tunnel syndrome.  ?01/23/2022 - Ortho (Dr RamNelva Bush ?Recent Hospitalization: ?12/11/2021 to 12/12/2021 [facility] [place] [facility] and nausea.  ?Medications started at discharge: tessalon perles 100m18mtimes a day as needed for cough up to 7 days. Aerochamber spacer. ondansetro 4mg 19mry 8 ours for 5 days.  ? ?Social History:  ?Patient has been living with her daughter WendyAbigail But[place]C [place] 05/[place]21. [place]  ? ?Objective: ? ?Lab Results  ?Component Value Date  ? CREATININE 1.04 11/03/2021  ? CREATININE 0.95 07/01/2021  ? CREATININE 1.0 11/08/2020  ? ? ?Lab Results  ?Component Value Date  ? HGBA1C 6.4 07/01/2021  ? ?Last diabetic Eye exam: No results found for: HMDIABEYEEXA  ?Last diabetic Foot exam: No results found for: HMDIABFOOTEX  ? ?   ?Component Value Date/Time  ? CHOL 155 11/08/2020 0000  ? TRIG 132 11/08/2020 0000  ? HDL 62 11/08/2020 0000  ? LDLCATulia1/14/2022 0000  ? ? ? ?  Latest Ref Rng & Units 07/01/2021  ?  2:48 PM 11/08/2020  ? 12:00 AM  ?Hepatic Function  ?Total Protein 6.0 - 8.3 g/dL 7.5     ?Albumin 3.5 - 5.2 g/dL 3.8   3.7       ?  AST 0 - 37 U/L 25   24       ?ALT 0 - 35 U/L 20   17       ?Alk Phosphatase 39 - 117 U/L 105   112       ?Total Bilirubin 0.2 - 1.2 mg/dL 0.5     ?  ? This result is from an external source.  ? ? ?Lab Results  ?Component Value Date/Time  ? TSH 2.53 07/01/2021 02:48 PM  ? ? ? ?  Latest Ref Rng & Units 07/01/2021  ?  2:48 PM  ?CBC  ?WBC 4.0 - 10.5 K/uL 8.8    ?Hemoglobin 12.0 - 15.0 g/dL 13.7    ?Hematocrit 36.0 - 46.0 % 40.5    ?Platelets 150.0 - 400.0 K/uL 282.0    ? ? ?Lab Results   ?Component Value Date/Time  ? VD25OH 8.13 (L) 11/03/2021 02:31 PM  ? ? ?Clinical ASCVD: Yes  ?The ASCVD Risk score (Arnett DK, et al., 2019) failed to calculate for the following reasons: ?  The 2019 ASCVD risk score is only valid for ages 73 to 90   ? ?DEXA 09/08/2021: ?Femur Neck Right  T-score = -2.6.  ?AP Spine L1-L4 T-score = -0.2  ?This patient is considered osteoporotic according to [facility]) criteria. The scan quality is good. ? ?Social History  ? ?Tobacco Use  ?Smoking Status Never  ?Smokeless Tobacco Never  ? ?BP Readings from Last 3 Encounters:  ?01/20/22 122/68  ?11/03/21 134/84  ?09/01/21 (!) 144/72  ? ?Pulse Readings from Last 3 Encounters:  ?01/20/22 89  ?11/03/21 90  ?09/01/21 (!) 101  ? ?Wt Readings from Last 3 Encounters:  ?02/12/22 212 lb (96.2 kg)  ?02/10/22 212 lb (96.2 kg)  ?01/20/22 212 lb 6 oz (96.3 kg)  ? ? ?Assessment: Review of patient past medical history, allergies, medications, health status, including review of consultants reports, laboratory and other test data, was performed as part of comprehensive evaluation and provision of chronic care management services.  ? ?SDOH:  (Social Determinants of Health) assessments and interventions performed:  ? ? ? ?CCM Care Plan ? ?No Known Allergies ? ?Medications Reviewed Today   ? ? Reviewed by [name], LPN (Licensed Practical Nurse) on 02/12/22 at 1501  Med List Status: <None>  ? ?Medication Order Taking? Sig Documenting Provider Last Dose Status Informant  ?Albuterol Sulfate (PROAIR RESPICLICK) 812 (90 Base) MCG/ACT AEPB [id number]  Inhale 2 puffs into the lungs every 6 (six) hours as needed. Colon Branch, MD  Active   ?alendronate (FOSAMAX) 70 MG tablet [id number]  Take 1 tablet (70 mg total) by mouth once a week. Take with a full glass of water on an empty stomach. Remain sitting upright for 45-60 mins Colon Branch, MD  Active   ?         ?Med Note Jonathon Jordan Nov 20, 2021  8:55 AM) Dewaine Conger on  Wednesdays  ?ALPRAZolam (XANAX) 0.25 MG tablet [id number]  Take 1-2 tablets (0.25-0.5 mg total) by mouth daily as needed for anxiety. Colon Branch, MD  Active   ?amLODipine (NORVASC) 5 MG tablet [id number]  Take 5 mg by mouth daily. [provider]  Active   ?atorvastatin (LIPITOR) 40 MG tablet [id number]  Take 40 mg by mouth daily. [provider]  Active   ?DULoxetine (CYMBALTA) 30 MG capsule [id number]  Take 1 capsule (30 mg total) by mouth daily. Take with 76m  capsule to equal 49m daily PColon Branch MD  Active   ?DULoxetine (CYMBALTA) 60 MG capsule [id number] Take 1 capsule (60 mg total) by mouth daily. Take with 347mcapsule to equal 90 mg daily Paz, JoAlda BertholdMD  Active   ?folic acid (FOLVITE) 0.5 MG tablet [id number] 1 mg by mouth daily. [provider]  Active   ?hydrochlorothiazide (HYDRODIURIL) 12.5 MG tablet [id number] 12.5 mg by mouth daily. [provider]  Active   ?lisinopril (ZESTRIL) 40 MG tablet [id number] 40 mg by mouth daily. [provider]  Active   ?metoprolol succinate (TOPROL-XL) 25 MG 24 hr tablet [id number] 1 tablet (25 mg total) by mouth daily. Take with or immediately following a meal. PaColon BranchMD  Active   ?naloxone ([facility] Cent[facility]asal spray 4 mg/0.1 mL [id number] 1 spray into the nose. As directed  ?Patient not taking: Reported on 02/10/2022  ? PaColon BranchMD  Active   ?omeprazole (PRILOSEC) 20 MG capsule [id number] 20 mg by mouth 2 (two) times daily before a meal. [provider]  Active   ?ondansetron (ZOFRAN) 4 MG tablet [id number] 1 tablet (4 mg total) by mouth every 8 (eight) hours as needed for nausea or vomiting.  ?Patient not taking: Reported on 02/10/2022  ? PaColon BranchMD  Active   ?oxyCODONE-acetaminophen (PERCOCET) 10-325 MG tablet [id number] Percocet 10 mg-325 mg tablet ? Take 0.5 tablets every 6 hours by oral route.  ?Patient not taking: Reported on 02/12/2022  ? [provider]  Not Taking Active   ?primidone (MYSOLINE) 50 MG tablet [id number] 100 mg by mouth at bedtime. [provider]  Active   ?traMADol (ULTRAM) 50 MG tablet [id number] Take 1 tablet (50 mg total) by

## 2022-02-22 DIAGNOSIS — I10 Essential (primary) hypertension: Secondary | ICD-10-CM

## 2022-02-22 DIAGNOSIS — M810 Age-related osteoporosis without current pathological fracture: Secondary | ICD-10-CM

## 2022-02-22 DIAGNOSIS — E785 Hyperlipidemia, unspecified: Secondary | ICD-10-CM | POA: Diagnosis not present

## 2022-02-24 ENCOUNTER — Other Ambulatory Visit: Payer: Self-pay | Admitting: Internal Medicine

## 2022-03-03 ENCOUNTER — Encounter: Payer: Medicare Other | Admitting: Internal Medicine

## 2022-03-06 ENCOUNTER — Other Ambulatory Visit: Payer: Self-pay | Admitting: Internal Medicine

## 2022-03-13 ENCOUNTER — Other Ambulatory Visit: Payer: Self-pay | Admitting: Internal Medicine

## 2022-03-16 ENCOUNTER — Encounter: Payer: Medicare Other | Admitting: Internal Medicine

## 2022-03-17 ENCOUNTER — Other Ambulatory Visit: Payer: Self-pay | Admitting: Internal Medicine

## 2022-03-17 MED ORDER — ALPRAZOLAM 0.5 MG PO TABS: 0.2500 mg | ORAL_TABLET | Freq: Every day | ORAL | 0 refills | Status: DC | PRN

## 2022-03-17 NOTE — Progress Notes (Signed)
PDMP reviewed, prescription sent 

## 2022-03-18 ENCOUNTER — Encounter: Payer: Medicare Other | Admitting: Internal Medicine

## 2022-03-24 ENCOUNTER — Encounter: Payer: Self-pay | Admitting: Internal Medicine

## 2022-03-24 ENCOUNTER — Ambulatory Visit (INDEPENDENT_AMBULATORY_CARE_PROVIDER_SITE_OTHER): Payer: Medicare Other | Admitting: Internal Medicine

## 2022-03-24 VITALS — BP 126/64 | HR 74 | Temp 98.1°F | Resp 18 | Ht 59.0 in | Wt 213.1 lb

## 2022-03-24 DIAGNOSIS — H8109 Meniere's disease, unspecified ear: Secondary | ICD-10-CM | POA: Diagnosis not present

## 2022-03-24 DIAGNOSIS — R42 Dizziness and giddiness: Secondary | ICD-10-CM | POA: Diagnosis not present

## 2022-03-24 DIAGNOSIS — E559 Vitamin D deficiency, unspecified: Secondary | ICD-10-CM

## 2022-03-24 DIAGNOSIS — R778 Other specified abnormalities of plasma proteins: Secondary | ICD-10-CM

## 2022-03-24 DIAGNOSIS — Z0000 Encounter for general adult medical examination without abnormal findings: Secondary | ICD-10-CM | POA: Diagnosis not present

## 2022-03-24 DIAGNOSIS — Z23 Encounter for immunization: Secondary | ICD-10-CM

## 2022-03-24 DIAGNOSIS — R296 Repeated falls: Secondary | ICD-10-CM

## 2022-03-24 DIAGNOSIS — R1310 Dysphagia, unspecified: Secondary | ICD-10-CM

## 2022-03-24 DIAGNOSIS — I10 Essential (primary) hypertension: Secondary | ICD-10-CM | POA: Diagnosis not present

## 2022-03-24 DIAGNOSIS — E785 Hyperlipidemia, unspecified: Secondary | ICD-10-CM

## 2022-03-24 DIAGNOSIS — R739 Hyperglycemia, unspecified: Secondary | ICD-10-CM | POA: Diagnosis not present

## 2022-03-24 DIAGNOSIS — K22719 Barrett's esophagus with dysplasia, unspecified: Secondary | ICD-10-CM

## 2022-03-24 DIAGNOSIS — Z79899 Other long term (current) drug therapy: Secondary | ICD-10-CM | POA: Diagnosis not present

## 2022-03-24 DIAGNOSIS — F419 Anxiety disorder, unspecified: Secondary | ICD-10-CM | POA: Diagnosis not present

## 2022-03-24 DIAGNOSIS — H532 Diplopia: Secondary | ICD-10-CM

## 2022-03-24 DIAGNOSIS — Z0001 Encounter for general adult medical examination with abnormal findings: Secondary | ICD-10-CM

## 2022-03-24 MED ORDER — SHINGRIX 50 MCG/0.5ML IM SUSR: 0.5000 mL | Freq: Once | INTRAMUSCULAR | 1 refills | Status: AC

## 2022-03-24 MED ORDER — ONDANSETRON HCL 4 MG PO TABS: 4.0000 mg | ORAL_TABLET | Freq: Three times a day (TID) | ORAL | 0 refills | Status: DC | PRN

## 2022-03-24 NOTE — Patient Instructions (Addendum)
Recommend to proceed with Shingrix (shingles) vaccine at your pharmacy- see printed prescription.   Take vitamin D: At least 2000 units daily  Referrals: Neurology Gastroenterology Physical therapy for vestibular rehabilitation  GO TO THE LAB : Get the blood work     GO TO THE FRONT DESK, PLEASE SCHEDULE YOUR APPOINTMENTS Come back for a checkup in 4 months     "Living will", "Health Care Power of attorney": Advanced care planning  (If you already have a living will or healthcare power of attorney, please bring the copy to be scanned in your chart.)  Advance care planning is a process that supports adults in  understanding and sharing their preferences regarding future medical care.   The patient's preferences are recorded in documents called Advance Directives.    Advanced directives are completed (and can be modified at any time) while the patient is in full mental capacity.   The documentation should be available at all times to the patient, the family and the healthcare providers.  Bring in a copy to be scanned in your chart is an excellent idea and is recommended   This legal documents direct treatment decision making and/or appoint a surrogate to make the decision if the patient is not capable to do so.    Advance directives can be documented in many types of formats,  documents have names such as:  Lliving will  Durable power of attorney for healthcare (healthcare proxy or healthcare power of attorney)  Combined directives  Physician orders for life-sustaining treatment    More information at:  [email or web address]

## 2022-03-24 NOTE — Progress Notes (Signed)
Subjective:    Patient ID: Madeline Daniel, female    DOB: 01/30/1941, 81 y.o.   MRN: [id number]  DOS:  03/24/2022 Type of visit - description: Here with her daughter, for CPX  Here for CPX Has multiple concerns: - "Always tired", check TSH? - History of dysphagia, still have problems, mostly with solids, no associated cough. - During review of systems, she reports diplopia, on and off for a while, has seen an eye doctor in [place], no diagnosis was made. - Gait and lack of balance: Still an issue, PT?.  Review of Systems Denies chest pain, difficulty breathing or palpitations. No slurred speech.     Other than above, a 14 point review of systems is negative     Past Medical History:  Diagnosis Date   Anxiety    Arthritis    Atherosclerosis of aorta (HCC)    Barrett's esophagus    Bleeding ulcer 2005   CKD (chronic kidney disease), stage III (HCC)    Depression    Essential tremor    Gait disorder    GERD (gastroesophageal reflux disease)    History of rib fracture    multiple   History of stomach ulcers    Hyperlipidemia    Hypertension    Insomnia    Meniere disease    Nonalcoholic liver disease, chronic    Peptic ulcer disease    TIA (transient ischemic attack)    Vertigo     Past Surgical History:  Procedure Laterality Date   CARPAL TUNNEL RELEASE Right 02/06/2022   CATARACT EXTRACTION Bilateral    CHOLECYSTECTOMY     REPLACEMENT TOTAL KNEE BILATERAL Bilateral    Social History   Socioeconomic History   Marital status: Widowed    Spouse name: Not on file   Number of children: 3   Years of education: Not on file   Highest education level: Not on file  Occupational History   Occupation: retired- Audiological scientist  Tobacco Use   Smoking status: Never   Smokeless tobacco: Never  Substance and Sexual Activity   Alcohol use: Yes    Comment: 2-3 glasses   Drug use: Not on file   Sexual activity: Not on file  Other Topics Concern   Not on file  Social  History Narrative   Moved to live part  time in [place] 2022   Lives w/ daughter [name]   Social Determinants of Health   Financial Resource Strain: Low Risk    Difficulty of Paying Living Expenses: Not hard at all  Food Insecurity: No Food Insecurity   Worried About Programme researcher, broadcasting/film/video in the Last Year: Never true   Barista in the Last Year: Never true  Transportation Needs: Unknown   Lack of Transportation (Medical): Not on file   Lack of Transportation (Non-Medical): No  Physical Activity: Insufficiently Active   Days of Exercise per Week: 3 days   Minutes of Exercise per Session: 20 min  Stress: No Stress Concern Present   Feeling of Stress : Not at all  Social Connections: Moderately Isolated   Frequency of Communication with Friends and Family: More than three times a week   Frequency of Social Gatherings with Friends and Family: More than three times a week   Attends Religious Services: 1 to 4 times per year   Active Member of [employer] or Organizations: No   Attends Banker Meetings: Never   Marital Status: Widowed  Intimate Partner Violence: Not  At Risk   Fear of Current or Ex-Partner: No   Emotionally Abused: No   Physically Abused: No   Sexually Abused: No     Current Outpatient Medications  Medication Instructions   Albuterol Sulfate (PROAIR RESPICLICK) 108 (90 Base) MCG/ACT AEPB 2 puffs, Inhalation, Every 6 hours PRN   alendronate (FOSAMAX) 70 mg, Oral, Weekly, Take with a full glass of water on an empty stomach. Remain sitting upright for 45-60 mins   ALPRAZolam (XANAX) 0.25-0.5 mg, Oral, Daily PRN   amLODipine (NORVASC) 5 mg, Oral, Daily   atorvastatin (LIPITOR) 40 mg, Oral, Daily   DULoxetine (CYMBALTA) 30 mg, Oral, Daily, Take with 60mg  capsule to equal 90mg  daily   DULoxetine (CYMBALTA) 60 mg, Oral, Daily, Take with 30mg  capsule to equal 90 mg daily   folic acid (FOLVITE) 1 mg, Oral, Daily   hydrochlorothiazide (HYDRODIURIL) 12.5 mg, Oral,  Daily   lisinopril (ZESTRIL) 40 mg, Oral, Daily   metoprolol succinate (TOPROL-XL) 25 MG 24 hr tablet TAKE 1 TABLET BY MOUTH DAILY  WITH A MEAL OR IMMEDIATELY  FOLLOWING A MEAL   naloxone (NARCAN) nasal spray 4 mg/0.1 mL 1 spray   omeprazole (PRILOSEC) 20 mg, Oral, 2 times daily before meals   ondansetron (ZOFRAN) 4 mg, Oral, Every 8 hours PRN   primidone (MYSOLINE) 100 mg, Oral, Daily at bedtime   traMADol (ULTRAM) 50 mg, Oral, Daily PRN   Vitamin D3 2,000 Units, Oral, Daily   Zoster Vaccine Adjuvanted (SHINGRIX) injection 0.5 mLs, Intramuscular,  Once       Objective:   Physical Exam BP 126/64   Pulse 74   Temp 98.1 F (36.7 C) (Oral)   Resp 18   Ht 4\' 11"  (1.499 m)   Wt 213 lb 2 oz (96.7 kg)   SpO2 96%   BMI 43.05 kg/m  General: Well developed, NAD, BMI noted Neck: No  thyromegaly.  Normal carotid pulses HEENT:  Normocephalic . Face symmetric, atraumatic Lungs:  CTA B Normal respiratory effort, no intercostal retractions, no accessory muscle use. Heart: RRR,  no murmur.  Abdomen:  Not distended, soft, non-tender. No rebound or rigidity.   Lower extremities: no pretibial edema bilaterally  Skin: Exposed areas without rash. Not pale. Not jaundice Neurologic:  alert & oriented X3.  Speech normal, gait assisted by walker. + Tremors noted   Psych: Cognition and judgment appear intact.  Cooperative with normal attention span and concentration.  Behavior appropriate. No anxious or depressed appearing.     Assessment    Assessment (new patient, referred by her daughter Rosaura CarpenterWendy Chadwell) Hyperglycemia HTN High cholesterol Anxiety depression GI: -GERD, h/o esophageal dilatations , Barrett's  (see GI note 03/26/2020) -EGD:  04/02/2020: Normal duodenum, suggestion of Barrett's esophagus, hiatal hernia (dilatation), polyp stomach. Pathology not available -Question of liver cirrhosis, incidental finding on CTA 11/05/2019. NEURO -Essential Tremor, onset age 4450s  on  primidone -Brain MRI-- volume loss and small vessel ischemic disease. -H./o TIA  -Menire, lack of balance, zofran prn -Gait d/o: due to dizziness >> DJD; uses a walker, h/o frequent fall Bronchospasm  DJD knee-hands-neck , tramadol prn Vitamin D deficiency Social: Move with her daughter Toniann FailWendy from [place], plans to live in [place] during the winters   PLAN:  Here for CPX Hyperglycemia: Check A1c HTN: BP today is looks good, continue amlodipine, HCTZ, lisinopril, metoprolol.  Labs. High cholesterol: On Lipitor, check FLP Anxiety, depression: On Cymbalta, Xanax.  Check UDS. GERD, history of esoph dilatation: has some dysphagia to solids,  thinks she may need another dilatation, refer to local GI (previously seen in [place]) Neuro: History of essential tremor, TIA, gait disorder; today she also reports diplopia, not of new onset.  Refer to neurology, further eval? Also refer to PT to help w/ balance Abnormal SPEP: per chart review,  re check SPEP. DJD: does not like to take tramadol, I favor Tylenol only rather than NSAIDs. Vitamin D deficiency: Recommend daily vitamin D, checking labs RTC 4 months  In addition to CPX, I addressed all her chronic medical problems and symptoms.

## 2022-03-24 NOTE — Assessment & Plan Note (Signed)
  Tdap 04-2021 PNM 23: 2022 Prevnar: Today Shingrix: Rx printed Bivalent Covid vax November 2022, booster is an option. CCS: Aged out Breast cancer screening: Aged out Labs: CMP FLP CBC A1c TSH vitamin , SPEP Advance care planning: Information provided

## 2022-03-24 NOTE — Assessment & Plan Note (Addendum)
Here for CPX Hyperglycemia: Check A1c HTN: BP today is looks good, continue amlodipine, HCTZ, lisinopril, metoprolol.  Labs. High cholesterol: On Lipitor, check FLP Anxiety, depression: On Cymbalta, Xanax.  Check UDS. GERD, history of esoph dilatation: has some dysphagia to solids, thinks she may need another dilatation, refer to local GI (previously seen in [place]) Neuro: History of essential tremor, TIA, gait disorder; today she also reports diplopia, not of new onset.  Refer to neurology, further eval? Also refer to PT to help w/ balance Abnormal SPEP: per chart review,  re check SPEP. DJD: does not like to take tramadol, I favor Tylenol only rather than NSAIDs. Vitamin D deficiency: Recommend daily vitamin D, checking labs RTC 4 months

## 2022-03-25 DIAGNOSIS — G5603 Carpal tunnel syndrome, bilateral upper limbs: Secondary | ICD-10-CM | POA: Diagnosis not present

## 2022-03-25 DIAGNOSIS — M65342 Trigger finger, left ring finger: Secondary | ICD-10-CM | POA: Diagnosis not present

## 2022-03-25 LAB — CBC WITH DIFFERENTIAL/PLATELET
Basophils Absolute: 0.1 10*3/uL (ref 0.0–0.1)
Basophils Relative: 1.3 % (ref 0.0–3.0)
Eosinophils Absolute: 0.2 10*3/uL (ref 0.0–0.7)
Eosinophils Relative: 1.9 % (ref 0.0–5.0)
HCT: 39.1 % (ref 36.0–46.0)
Hemoglobin: 13.2 g/dL (ref 12.0–15.0)
Lymphocytes Relative: 27.4 % (ref 12.0–46.0)
Lymphs Abs: 2.2 10*3/uL (ref 0.7–4.0)
MCHC: 33.7 g/dL (ref 30.0–36.0)
MCV: 98.2 fl (ref 78.0–100.0)
Monocytes Absolute: 0.8 10*3/uL (ref 0.1–1.0)
Monocytes Relative: 9.9 % (ref 3.0–12.0)
Neutro Abs: 4.8 10*3/uL (ref 1.4–7.7)
Neutrophils Relative %: 59.5 % (ref 43.0–77.0)
Platelets: 268 10*3/uL (ref 150.0–400.0)
RBC: 3.98 Mil/uL (ref 3.87–5.11)
RDW: 12.5 % (ref 11.5–15.5)
WBC: 8 10*3/uL (ref 4.0–10.5)

## 2022-03-25 LAB — LIPID PANEL
Cholesterol: 178 mg/dL (ref 0–200)
HDL: 77.5 mg/dL (ref >39.00)
LDL Cholesterol: 73 mg/dL (ref 0–99)
NonHDL: 100.16
Total CHOL/HDL Ratio: 2
Triglycerides: 138 mg/dL (ref 0.0–149.0)
VLDL: 27.6 mg/dL (ref 0.0–40.0)

## 2022-03-25 LAB — COMPREHENSIVE METABOLIC PANEL
ALT: 17 U/L (ref 0–35)
AST: 21 U/L (ref 0–37)
Albumin: 3.9 g/dL (ref 3.5–5.2)
Alkaline Phosphatase: 94 U/L (ref 39–117)
BUN: 17 mg/dL (ref 6–23)
CO2: 30 mEq/L (ref 19–32)
Calcium: 9.4 mg/dL (ref 8.4–10.5)
Chloride: 90 mEq/L — ABNORMAL LOW (ref 96–112)
Creatinine, Ser: 0.94 mg/dL (ref 0.40–1.20)
GFR: 56.3 mL/min — ABNORMAL LOW (ref >60.00)
Glucose, Bld: 117 mg/dL — ABNORMAL HIGH (ref 70–99)
Potassium: 4.2 mEq/L (ref 3.5–5.1)
Sodium: 130 mEq/L — ABNORMAL LOW (ref 135–145)
Total Bilirubin: 0.5 mg/dL (ref 0.2–1.2)
Total Protein: 7.4 g/dL (ref 6.0–8.3)

## 2022-03-25 LAB — HEMOGLOBIN A1C: Hgb A1c MFr Bld: 6 % (ref 4.6–6.5)

## 2022-03-25 LAB — VITAMIN D 25 HYDROXY (VIT D DEFICIENCY, FRACTURES): VITD: 21.89 ng/mL — ABNORMAL LOW (ref 30.00–100.00)

## 2022-03-25 LAB — TSH: TSH: 2.24 u[IU]/mL (ref 0.35–5.50)

## 2022-03-26 LAB — PROTEIN ELECTROPHORESIS, SERUM
Albumin ELP: 3.7 g/dL — ABNORMAL LOW (ref 3.8–4.8)
Alpha 1: 0.4 g/dL — ABNORMAL HIGH (ref 0.2–0.3)
Alpha 2: 0.8 g/dL (ref 0.5–0.9)
Beta 2: 0.5 g/dL (ref 0.2–0.5)
Beta Globulin: 0.5 g/dL (ref 0.4–0.6)
Gamma Globulin: 1.7 g/dL (ref 0.8–1.7)
Total Protein: 7.5 g/dL (ref 6.1–8.1)

## 2022-03-30 MED ORDER — VITAMIN D (ERGOCALCIFEROL) 1.25 MG (50000 UNIT) PO CAPS: 50000.0000 [IU] | ORAL_CAPSULE | ORAL | 0 refills | Status: DC

## 2022-03-30 NOTE — Addendum Note (Signed)
Addended byConrad Valle D on: 03/30/2022 08:43 AM   Modules accepted: Orders

## 2022-04-07 ENCOUNTER — Ambulatory Visit (INDEPENDENT_AMBULATORY_CARE_PROVIDER_SITE_OTHER): Payer: Medicare Other | Admitting: Family Medicine

## 2022-04-07 ENCOUNTER — Encounter: Payer: Self-pay | Admitting: Family Medicine

## 2022-04-07 VITALS — BP 112/51 | HR 72 | Temp 97.5°F | Ht 59.0 in | Wt 212.6 lb

## 2022-04-07 DIAGNOSIS — H9201 Otalgia, right ear: Secondary | ICD-10-CM

## 2022-04-07 MED ORDER — AMOXICILLIN-POT CLAVULANATE 875-125 MG PO TABS: 1.0000 | ORAL_TABLET | Freq: Two times a day (BID) | ORAL | 0 refills | Status: AC

## 2022-04-07 MED ORDER — FLUTICASONE PROPIONATE 50 MCG/ACT NA SUSP: 2.0000 | Freq: Every day | NASAL | 7 refills | Status: DC

## 2022-04-07 NOTE — Progress Notes (Signed)
Acute Office Visit  Subjective:     Patient ID: Madeline Daniel, female    DOB: June 20, 1941, 81 y.o.   MRN: [id number]  CC: right ear pain   HPI Patient is in today for right ear pain  Patient is reporting that starting about 2 to 3 days ago she began having significant right-sided ear pain that has been constant, 5/10.  Pain does worsen with palpation and even being out in the wind/air conditioning was making the inside of her ear hurt.  States she has felt a little clammy with chills over the past few days but has not checked her temperature.  She has been getting temporary improvement with Tylenol.  She has not noticed any drainage, bleeding, swelling, dental pain, radiation of pain.  States that about 2 weeks ago she had a GI virus and prior to that she was struggling with some sinus/allergy symptoms along with worsening dizziness that was potentially related to fluid in her ear.  States she does have some baseline dizziness, but since the ear pain started it has been significantly worse.  States she does not have a spinning sensation she just feels slightly off balance.  She denies any current chest pain, dyspnea, GI/GU symptoms.   ROS All review of systems negative except what is listed in the HPI      Objective:    BP (!) 112/51   Pulse 72   Temp (!) 97.5 F (36.4 C)   Ht 4\' 11"  (1.499 m)   Wt 212 lb 9.6 oz (96.4 kg)   BMI 42.94 kg/m    Physical Exam Vitals reviewed.  Constitutional:      Appearance: Normal appearance.  HENT:     Head:     Comments: Right TM without significant erythema but does appear to be irritated with excessive fluid that is cloudy    Right Ear: Ear canal and external ear normal. Tenderness present. A middle ear effusion is present. There is no impacted cerumen. Tympanic membrane is bulging. Tympanic membrane is not injected.     Left Ear: Tympanic membrane, ear canal and external ear normal. There is no impacted cerumen.  Cardiovascular:      Rate and Rhythm: Normal rate and regular rhythm.  Pulmonary:     Effort: Pulmonary effort is normal.     Breath sounds: Normal breath sounds.  Neurological:     General: No focal deficit present.     Mental Status: She is alert and oriented to person, place, and time. Mental status is at baseline.  Psychiatric:        Mood and Affect: Mood normal.        Behavior: Behavior normal.        Thought Content: Thought content normal.        Judgment: Judgment normal.      No results found for any visits on 04/07/22.      Assessment & Plan:   1. Right ear pain Discussed differentials including middle ear effusion, viral infection, early AOM.  Recommend she start using daily Flonase.  Given the significant discomfort and TM abnormality, will send in an antibiotic for her to start in the next 1 to 2 days if symptoms do not begin to improve or worsen.  Can try warm compresses, Tylenol. Patient aware of signs/symptoms requiring further/urgent evaluation.  - amoxicillin-clavulanate (AUGMENTIN) 875-125 MG tablet; Take 1 tablet by mouth 2 (two) times daily for 7 days.  Dispense: 14 tablet; Refill: 0 -  fluticasone (FLONASE) 50 MCG/ACT nasal spray; Place 2 sprays into both nostrils daily.  Dispense: 1 g; Refill: 7   Return if symptoms worsen or fail to improve.  Clayborne Dana, NP

## 2022-04-10 ENCOUNTER — Other Ambulatory Visit: Payer: Self-pay | Admitting: Internal Medicine

## 2022-04-10 MED ORDER — ALPRAZOLAM 0.5 MG PO TABS: 0.2500 mg | ORAL_TABLET | Freq: Every day | ORAL | 0 refills | Status: DC | PRN

## 2022-04-10 NOTE — Telephone Encounter (Signed)
Requesting: alprazolam 0.5mg  Contract: 11/03/21  UDS: 11/03/21 Last Visit: 03/24/22 Next Visit: 08/17/22  Last Refill: 03/17/22 #30 and 0RF  Please Advise

## 2022-04-15 DIAGNOSIS — R262 Difficulty in walking, not elsewhere classified: Secondary | ICD-10-CM | POA: Diagnosis not present

## 2022-04-15 DIAGNOSIS — H8112 Benign paroxysmal vertigo, left ear: Secondary | ICD-10-CM | POA: Diagnosis not present

## 2022-04-15 DIAGNOSIS — M6281 Muscle weakness (generalized): Secondary | ICD-10-CM | POA: Diagnosis not present

## 2022-04-15 DIAGNOSIS — Z9181 History of falling: Secondary | ICD-10-CM | POA: Diagnosis not present

## 2022-04-17 ENCOUNTER — Telehealth: Payer: Self-pay

## 2022-04-17 NOTE — Telephone Encounter (Signed)
PT plan of care signed and faxed to [facility] at [phone]. Form sent for scanning.

## 2022-04-22 DIAGNOSIS — M6281 Muscle weakness (generalized): Secondary | ICD-10-CM | POA: Diagnosis not present

## 2022-04-22 DIAGNOSIS — Z9181 History of falling: Secondary | ICD-10-CM | POA: Diagnosis not present

## 2022-04-22 DIAGNOSIS — H8112 Benign paroxysmal vertigo, left ear: Secondary | ICD-10-CM | POA: Diagnosis not present

## 2022-04-22 DIAGNOSIS — R262 Difficulty in walking, not elsewhere classified: Secondary | ICD-10-CM | POA: Diagnosis not present

## 2022-04-24 DIAGNOSIS — R262 Difficulty in walking, not elsewhere classified: Secondary | ICD-10-CM | POA: Diagnosis not present

## 2022-04-24 DIAGNOSIS — Z9181 History of falling: Secondary | ICD-10-CM | POA: Diagnosis not present

## 2022-04-24 DIAGNOSIS — H8112 Benign paroxysmal vertigo, left ear: Secondary | ICD-10-CM | POA: Diagnosis not present

## 2022-04-24 DIAGNOSIS — M6281 Muscle weakness (generalized): Secondary | ICD-10-CM | POA: Diagnosis not present

## 2022-04-27 DIAGNOSIS — H8112 Benign paroxysmal vertigo, left ear: Secondary | ICD-10-CM | POA: Diagnosis not present

## 2022-04-27 DIAGNOSIS — Z9181 History of falling: Secondary | ICD-10-CM | POA: Diagnosis not present

## 2022-04-27 DIAGNOSIS — M6281 Muscle weakness (generalized): Secondary | ICD-10-CM | POA: Diagnosis not present

## 2022-04-27 DIAGNOSIS — R262 Difficulty in walking, not elsewhere classified: Secondary | ICD-10-CM | POA: Diagnosis not present

## 2022-04-29 ENCOUNTER — Encounter: Payer: Self-pay | Admitting: Internal Medicine

## 2022-04-29 ENCOUNTER — Other Ambulatory Visit: Payer: Self-pay | Admitting: Internal Medicine

## 2022-04-29 ENCOUNTER — Other Ambulatory Visit: Payer: Self-pay

## 2022-04-29 MED ORDER — ALBUTEROL SULFATE HFA 108 (90 BASE) MCG/ACT IN AERS: 1.0000 | INHALATION_SPRAY | Freq: Four times a day (QID) | RESPIRATORY_TRACT | 5 refills | Status: AC | PRN

## 2022-05-04 ENCOUNTER — Ambulatory Visit: Payer: Medicare Other | Admitting: Neurology

## 2022-05-04 ENCOUNTER — Encounter: Payer: Self-pay | Admitting: Neurology

## 2022-05-04 ENCOUNTER — Telehealth: Payer: Self-pay | Admitting: Neurology

## 2022-05-04 VITALS — BP 153/83 | HR 80 | Ht 59.0 in | Wt 211.0 lb

## 2022-05-04 DIAGNOSIS — R269 Unspecified abnormalities of gait and mobility: Secondary | ICD-10-CM | POA: Diagnosis not present

## 2022-05-04 DIAGNOSIS — H532 Diplopia: Secondary | ICD-10-CM

## 2022-05-04 DIAGNOSIS — H905 Unspecified sensorineural hearing loss: Secondary | ICD-10-CM | POA: Diagnosis not present

## 2022-05-04 DIAGNOSIS — G250 Essential tremor: Secondary | ICD-10-CM | POA: Diagnosis not present

## 2022-05-04 MED ORDER — PROPRANOLOL HCL 20 MG PO TABS: 20.0000 mg | ORAL_TABLET | Freq: Three times a day (TID) | ORAL | 6 refills | Status: AC | PRN

## 2022-05-04 NOTE — Telephone Encounter (Signed)
[employer] medicare NPR sent to GI 

## 2022-05-04 NOTE — Progress Notes (Addendum)
Chief Complaint  Patient presents with   New Patient (Initial Visit)    Rm 15, with daughter  NP internal referral for Diplopia, meniere's disease      ASSESSMENT AND PLAN  Madeline Daniel is a 81 y.o. female   Gait abnormality  Known history of cervical degenerative disease, brisk reflex on examination, bilateral Babinski signs,  Her gait abnormality likely multifactorial, age, deconditioning, obesity, joint pain, cervical degenerative disease,  Referral to physical therapy  She had MRI of cervical spine at [facility],  will drop the disc to review  Right hearing loss, intermittent double vision  MRI of the brain with without contrast through internal acoustic canal to rule out structural abnormality  Acetylcholine receptor antibodies  Essential tremor  Long history, gradually getting worse,  Primidone50 mg 2 tablets every night was helpful, add on Inderal 10 to 20 mg as needed  DIAGNOSTIC DATA (LABS, IMAGING, TESTING) - I reviewed patient records, labs, notes, testing and imaging myself where available.  Laboratory evaluations in 2023, normal TSH, A1c, CBC, lipid panel, CMP showed mild elevated glucose 117, sodium was decreased 130, vitamin D 22 protein electrophoresis no significant abnormality  I personally reviewed MRI of cervical spine from [facility] September 10, 2021, multilevel degenerative changes, most obvious C5-6, no significant canal stenosis, variable degree of foraminal narrowing at different levels  Above findings would certainly not explain her gait abnormality   MEDICAL HISTORY:  Madeline Daniel is a 81 year old female, accompanied by her daughter Madeline Daniel, seen in request by her primary care physician Dr. Drue Novel, [facility], for evaluation of tremor, intermittent double vision, gait abnormality, initial evaluation was on May 04, 2022  I reviewed and summarized the referring note. PMHX. Osteoporosis HLD HTN Depression anxiety,  Chronic  insomnia. 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, no longer has recurrent severe vertigo, but over the past few years, she has constant loud ringing sound in her right ear, decreased hearing on the right side  She also complains of intermittent double vision, was able to discern it was binocular transient double vision, lasting for few minutes, but multiple recurrent episode  She denies significant swallowing difficulty no dysarthria, complains of generalized weakness  She denies bowel and bladder incontinence  PHYSICAL EXAM:   Vitals:   05/04/22 0927  BP: (!) 153/83  Pulse: 80  Weight: 211 lb (95.7 kg)  Height: 4\' 11"  (1.499 m)   Not recorded     Body mass index is 42.62 kg/m.  PHYSICAL EXAMNIATION:  Gen: NAD, conversant, well nourised, well groomed                     Cardiovascular: Regular rate rhythm, no peripheral edema, warm, nontender. Eyes: Conjunctivae clear without exudates or hemorrhage Neck: Supple, no carotid bruits. Pulmonary: Clear to auscultation bilaterally   NEUROLOGICAL EXAM:  MENTAL STATUS: Speech/cognition: Depressed looking elderly female, awake, alert, oriented to history taking and casual conversation CRANIAL NERVES: CN  II: Visual fields are full to confrontation. Pupils are small and equal, sluggish reactive to light. CN III, IV, VI: extraocular movement are normal. No ptosis. CN V:  Facial sensation is intact to light touch CN VII: Face is symmetric with normal eye closure  CN VIII: Hearing is normal to causal conversation. CN IX, X: Phonation is normal. CN XI: Head turning and shoulder shrug are intact  MOTOR: Bilateral hand postural tremor, no weakness, no rigidity  REFLEXES: Reflexes are 2+ and symmetric at the biceps, triceps, knees, and trace at ankles. Plantar responses are extensor bilaterally  SENSORY: Decreased vibratory sensation at toes, mildly decreased pinprick to ankle level  COORDINATION: There is no trunk or limb dysmetria noted.  GAIT/STANCE: Need push-up to get up from seated position, wide-based, cautious, unsteady  REVIEW OF SYSTEMS:  Full 14 system review of systems performed and notable only for as above All other review of systems were negative.   ALLERGIES: No Known Allergies  HOME MEDICATIONS: Current Outpatient Medications  Medication Sig Dispense Refill   albuterol (VENTOLIN HFA) 108 (90 Base) MCG/ACT inhaler Inhale 1-2 puffs into the lungs every 6 (six) hours as needed for wheezing or shortness of breath. 18 g 5   alendronate (FOSAMAX) 70 MG tablet Take 1 tablet (70 mg total) by mouth once a week. Take with a full glass of water on an empty stomach. Remain sitting upright for 45-60 mins 12 tablet 3   ALPRAZolam (XANAX) 0.5 MG tablet Take 0.5-1 tablets (0.25-0.5 mg total) by mouth daily as needed for anxiety. 30 tablet 0   amLODipine (NORVASC) 5 MG tablet Take 5 mg by mouth daily.     atorvastatin (LIPITOR) 40 MG tablet Take 40 mg by mouth daily.     Cholecalciferol (VITAMIN D3) 50 MCG (2000 UT) capsule Take 1 capsule (2,000 Units total) by mouth daily. 100 capsule 0   DULoxetine (CYMBALTA) 30 MG capsule Take 1 capsule (30 mg total) by mouth daily. Take with 60mg  capsule to equal 90mg  daily 90 capsule 1   DULoxetine (CYMBALTA) 60 MG capsule Take 1 capsule (60 mg total) by mouth daily. Take with 30mg  capsule to equal 90 mg daily 90  capsule 1   folic acid (FOLVITE) 0.5 MG tablet Take 1 mg by mouth daily.     hydrochlorothiazide (HYDRODIURIL) 12.5 MG tablet Take 12.5 mg by mouth daily.     lisinopril (ZESTRIL) 40 MG tablet Take 40 mg by mouth daily.     metoprolol succinate (TOPROL-XL) 25 MG 24 hr tablet TAKE 1 TABLET BY MOUTH DAILY  WITH A MEAL OR IMMEDIATELY  FOLLOWING A MEAL 90 tablet 3   naloxone (NARCAN) nasal spray 4 mg/0.1 mL Place 1 spray into the nose. As directed (Patient not taking: Reported on 02/10/2022)     omeprazole (PRILOSEC) 20 MG capsule Take 20 mg by mouth 2 (two) times daily before a meal.     ondansetron (ZOFRAN) 4 MG tablet Take 1 tablet (4 mg total) by mouth every 8 (eight) hours as needed for nausea or vomiting. 90 tablet 0   primidone (MYSOLINE) 50 MG tablet Take 100 mg by mouth at bedtime.     Vitamin D, Ergocalciferol, (DRISDOL) 1.25 MG (50000 UNIT) CAPS capsule Take 1 capsule (50,000 Units total) by mouth every 7 (seven) days. 12 capsule 0   No current facility-administered medications for this visit.    PAST MEDICAL HISTORY: Past Medical History:  Diagnosis Date   Anxiety    Arthritis  Atherosclerosis of aorta (HCC)    Barrett's esophagus    Bleeding ulcer 2005   CKD (chronic kidney disease), stage III (HCC)    Depression    Essential tremor    Gait disorder    GERD (gastroesophageal reflux disease)    History of rib fracture    multiple   History of stomach ulcers    Hyperlipidemia    Hypertension    Insomnia    Meniere disease    Nonalcoholic liver disease, chronic    Peptic ulcer disease    TIA (transient ischemic attack)    Vertigo     PAST SURGICAL HISTORY: Past Surgical History:  Procedure Laterality Date   CARPAL TUNNEL RELEASE Right 02/06/2022   CATARACT EXTRACTION Bilateral    CHOLECYSTECTOMY     REPLACEMENT TOTAL KNEE BILATERAL Bilateral     FAMILY HISTORY: Family History  Problem Relation Age of Onset   Hypertension Mother    Diabetes Father    CAD  Father        lives till 78 y/o   Throat cancer Father    Lung cancer Father    Heart failure Father    Hypertension Father    Heart attack Father    Colon cancer Brother        68s   Leukemia Paternal Grandfather    Breast cancer Neg Hx     SOCIAL HISTORY: Social History   Socioeconomic History   Marital status: Widowed    Spouse name: Not on file   Number of children: 3   Years of education: Not on file   Highest education level: Not on file  Occupational History   Occupation: retired- Audiological scientist  Tobacco Use   Smoking status: Never   Smokeless tobacco: Never  Substance and Sexual Activity   Alcohol use: Yes    Comment: 2-3 glasses   Drug use: Not on file   Sexual activity: Not on file  Other Topics Concern   Not on file  Social History Narrative   Moved to live part  time in [place] 2022   Lives w/ daughter Madeline Daniel   Social Determinants of Health   Financial Resource Strain: Low Risk  (02/12/2022)   Overall Financial Resource Strain (CARDIA)    Difficulty of Paying Living Expenses: Not hard at all  Food Insecurity: No Food Insecurity (02/12/2022)   Hunger Vital Sign    Worried About Running Out of Food in the Last Year: Never true    Ran Out of Food in the Last Year: Never true  Transportation Needs: Unknown (02/12/2022)   PRAPARE - Administrator, Civil Service (Medical): Not on file    Lack of Transportation (Non-Medical): No  Physical Activity: Insufficiently Active (02/12/2022)   Exercise Vital Sign    Days of Exercise per Week: 3 days    Minutes of Exercise per Session: 20 min  Stress: No Stress Concern Present (02/12/2022)   [employer] of Occupational Health - Occupational Stress Questionnaire    Feeling of Stress : Not at all  Social Connections: Moderately Isolated (02/12/2022)   Social Connection and Isolation Panel [NHANES]    Frequency of Communication with Friends and Family: More than three times a week    Frequency of Social  Gatherings with Friends and Family: More than three times a week    Attends Religious Services: 1 to 4 times per year    Active Member of [employer] or Organizations: No    Attends [employer]  or Organization Meetings: Never    Marital Status: Widowed  Intimate Partner Violence: Not At Risk (02/12/2022)   Humiliation, Afraid, Rape, and Kick questionnaire    Fear of Current or Ex-Partner: No    Emotionally Abused: No    Physically Abused: No    Sexually Abused: No      Levert Feinstein, M.D. Ph.D.  [facility] [address], [address] [place], [place] [address] Ph: [phone] Fax: [phone]  CC:  Wanda Plump, MD [address] [place],  [place] [address]  Wanda Plump, MD

## 2022-05-07 ENCOUNTER — Encounter: Payer: Self-pay | Admitting: Neurology

## 2022-05-08 ENCOUNTER — Ambulatory Visit
Admission: RE | Admit: 2022-05-08 | Discharge: 2022-05-08 | Disposition: A | Discharge status: Home / Self Care | Payer: Medicare Other | Source: Ambulatory Visit | Attending: Neurology | Admitting: Neurology

## 2022-05-08 DIAGNOSIS — R269 Unspecified abnormalities of gait and mobility: Secondary | ICD-10-CM | POA: Diagnosis not present

## 2022-05-08 DIAGNOSIS — G250 Essential tremor: Secondary | ICD-10-CM | POA: Diagnosis not present

## 2022-05-08 DIAGNOSIS — H532 Diplopia: Secondary | ICD-10-CM

## 2022-05-08 DIAGNOSIS — H905 Unspecified sensorineural hearing loss: Secondary | ICD-10-CM | POA: Diagnosis not present

## 2022-05-08 LAB — ACETYLCHOLINE RECEPTOR AB, ALL
AChR Binding Ab, Serum: <0.03 nmol/L (ref 0.00–0.24)
AChR-modulating Ab: 5 % (ref 0–45)
Acetylchol Block Ab: 14 % (ref 0–25)

## 2022-05-08 MED ORDER — GADOBENATE DIMEGLUMINE 529 MG/ML IV SOLN
19.0000 mL | Freq: Once | INTRAVENOUS | Status: AC | PRN
  Administered 2022-05-08: 19 mL via INTRAVENOUS

## 2022-05-11 ENCOUNTER — Telehealth: Payer: Self-pay | Admitting: Family Medicine

## 2022-05-11 MED ORDER — ALPRAZOLAM 0.5 MG PO TABS: 0.2500 mg | ORAL_TABLET | Freq: Every day | ORAL | 3 refills | Status: DC | PRN

## 2022-05-11 NOTE — Telephone Encounter (Signed)
PDMP okay, Rx sent 

## 2022-05-11 NOTE — Telephone Encounter (Signed)
Requesting: alprazolam 0.5mg   Contract: 11/03/21 UDS: 11/03/21 Last Visit: 03/24/22 Next Visit: 08/17/22 Last Refill: 04/10/22 #30 and 0RF  Please Advise

## 2022-05-12 DIAGNOSIS — M5412 Radiculopathy, cervical region: Secondary | ICD-10-CM | POA: Diagnosis not present

## 2022-05-12 NOTE — Progress Notes (Signed)
Brain MRI does not show any cause for her hearing loss

## 2022-05-12 NOTE — Progress Notes (Signed)
Acetyl choline receptor antibodies are negative

## 2022-05-15 ENCOUNTER — Encounter: Payer: Self-pay | Admitting: Nurse Practitioner

## 2022-05-19 ENCOUNTER — Ambulatory Visit (INDEPENDENT_AMBULATORY_CARE_PROVIDER_SITE_OTHER): Payer: Medicare Other | Admitting: Family Medicine

## 2022-05-19 ENCOUNTER — Encounter: Payer: Self-pay | Admitting: Family Medicine

## 2022-05-19 VITALS — BP 123/68 | HR 79 | Temp 97.7°F | Ht 59.0 in | Wt 209.8 lb

## 2022-05-19 DIAGNOSIS — R1031 Right lower quadrant pain: Secondary | ICD-10-CM

## 2022-05-19 DIAGNOSIS — R197 Diarrhea, unspecified: Secondary | ICD-10-CM

## 2022-05-19 DIAGNOSIS — R112 Nausea with vomiting, unspecified: Secondary | ICD-10-CM | POA: Diagnosis not present

## 2022-05-19 DIAGNOSIS — R42 Dizziness and giddiness: Secondary | ICD-10-CM | POA: Diagnosis not present

## 2022-05-19 LAB — COMPREHENSIVE METABOLIC PANEL
ALT: 22 U/L (ref 0–35)
AST: 25 U/L (ref 0–37)
Albumin: 4.1 g/dL (ref 3.5–5.2)
Alkaline Phosphatase: 104 U/L (ref 39–117)
BUN: 21 mg/dL (ref 6–23)
CO2: 31 mEq/L (ref 19–32)
Calcium: 9.7 mg/dL (ref 8.4–10.5)
Chloride: 88 mEq/L — ABNORMAL LOW (ref 96–112)
Creatinine, Ser: 0.94 mg/dL (ref 0.40–1.20)
GFR: 56.24 mL/min — ABNORMAL LOW (ref >60.00)
Glucose, Bld: 111 mg/dL — ABNORMAL HIGH (ref 70–99)
Potassium: 4.8 mEq/L (ref 3.5–5.1)
Sodium: 127 mEq/L — ABNORMAL LOW (ref 135–145)
Total Bilirubin: 0.8 mg/dL (ref 0.2–1.2)
Total Protein: 7.8 g/dL (ref 6.0–8.3)

## 2022-05-19 LAB — CBC
HCT: 40.7 % (ref 36.0–46.0)
Hemoglobin: 14 g/dL (ref 12.0–15.0)
MCHC: 34.4 g/dL (ref 30.0–36.0)
MCV: 97.5 fl (ref 78.0–100.0)
Platelets: 276 10*3/uL (ref 150.0–400.0)
RBC: 4.18 Mil/uL (ref 3.87–5.11)
RDW: 12.8 % (ref 11.5–15.5)
WBC: 10.9 10*3/uL — ABNORMAL HIGH (ref 4.0–10.5)

## 2022-05-19 MED ORDER — MECLIZINE HCL 25 MG PO TABS: 25.0000 mg | ORAL_TABLET | Freq: Three times a day (TID) | ORAL | 0 refills | Status: DC | PRN

## 2022-05-19 NOTE — Patient Instructions (Signed)
We will go ahead and get things going on your workup with labs and CT scan.  Keep your upcoming GI appointment.  If symptoms worsen before then, go to the emergency department.  For your vertigo - continue the home exercises. We can try some occasional Meclizine - use cautiously due to side effects discussed.

## 2022-05-19 NOTE — Progress Notes (Signed)
Acute Office Visit  Subjective:     Patient ID: Madeline Daniel, female    DOB: 04-Jan-1941, 81 y.o.   MRN: [id number]  CC: abdominal symptoms, worsening vertigo   HPI Patient is in today for abdominal symptoms, worsening vertigo.   Vertigo/Dizziness; - Patient reports that she always has baseline dizziness with history of vertigo, but she feels like she is having a worse flare the past week or so. She would like to try some Meclizine. States she has taken this in the past but cannot recall if it worked or not. She has done PT and knows the home exercises to try. No other headaches, URI symptoms, etc.   GI symptoms: - Patient reports that about 6 weeks ago she had a few days episode of diarrhea (3-4 episodes a day, then gradually improving over the next few days) with a couple episodes of vomiting and ongoing nausea. States she does have a baseline of nausea and needs occasional zofran, but that episode seemed worse than normal. She then felt fine for about 4-5 weeks until symptoms returned last week. She started with a few days of multiple episodes of diarrhea, an episode of vomiting, ongoing nausea and bloating. She has not had any diarrhea or vomiting the past 2 days, but she continues to feel nauseas, bloated, and some generalized mild abdominal discomfort, sometimes feeling distention. Reports she feels fatigued and has had a poor appetite, but she denies any blood in stool/vomit, fevers, chills, body aches, urinary changes. She has not noticed any food triggers. 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  Sees GI August 18th    ROS All review of systems negative except what is listed in the HPI      Objective:    BP 123/68   Pulse 79   Temp 97.7 F (36.5 C)   Ht 4\' 11"  (1.499 m)   Wt 209 lb 12.8 oz (95.2 kg)   BMI 42.37 kg/m    Physical Exam Vitals reviewed.  Constitutional:      General: She is not in acute distress.    Appearance: Normal appearance. She is obese.  She is not ill-appearing.  Cardiovascular:     Rate and Rhythm: Normal rate and regular rhythm.  Pulmonary:     Effort: Pulmonary effort is normal.     Breath sounds: Normal breath sounds.  Abdominal:     General: Bowel sounds are normal. There is no distension.     Palpations: Abdomen is soft. There is no mass.     Tenderness: There is abdominal tenderness. There is guarding. There is no rebound.  Skin:    General: Skin is warm and dry.  Neurological:     General: No focal deficit present.     Mental Status: She is alert and oriented to person, place, and time. Mental status is at baseline.  Psychiatric:        Mood and Affect: Mood normal.        Behavior: Behavior normal.        Thought Content: Thought content normal.        Judgment: Judgment normal.        No results found for any visits on 05/19/22.      Assessment & Plan:   Problem List Items Addressed This Visit       Other   Vertigo Chronic symptoms, but slightly worse than normal. Okay to try Meclizine - discussed risks vs benefits and side effects. Continue  home exercises to help relieve.    Relevant Medications   meclizine (ANTIVERT) 25 MG tablet   Other Visit Diagnoses     Right lower quadrant abdominal pain    -  Primary Significant tenderness to RLQ palpation. Discussed options with patient and she would like to go ahead with labs and imaging. She is scheduled to see GI in 3-4 weeks, but going out of town shortly after that and would like to get workup started as soon as possible.  Patient aware of signs/symptoms requiring further/urgent evaluation.    Relevant Orders   CBC   Comprehensive metabolic panel   CT ABDOMEN PELVIS W CONTRAST   Nausea vomiting and diarrhea   See above.     Relevant Orders   CBC   Comprehensive metabolic panel   CT ABDOMEN PELVIS W CONTRAST   Salmonella/Shigella Cult, Campy EIA and Shiga Toxin reflex       Meds ordered this encounter  Medications   meclizine  (ANTIVERT) 25 MG tablet    Sig: Take 1 tablet (25 mg total) by mouth 3 (three) times daily as needed for dizziness.    Dispense:  30 tablet    Refill:  0    Order Specific Question:   Supervising Provider    Answer:   Danise Edge A [[id number]]    Return if symptoms worsen or fail to improve.  Clayborne Dana, NP

## 2022-05-20 NOTE — Progress Notes (Signed)
Your sodium is slightly lower than it has been in the past. I discussed with Dr. Drue Novel and since this is low at baseline for you we can monitor and wait for your scan results. If you develop any worsening symptoms, seek emergent care. Otherwise we will let you know when other results are in. Make sure you keep upcoming appointments and follow-up sooner if needed.

## 2022-05-21 ENCOUNTER — Ambulatory Visit (HOSPITAL_BASED_OUTPATIENT_CLINIC_OR_DEPARTMENT_OTHER)
Admission: RE | Admit: 2022-05-21 | Discharge: 2022-05-21 | Disposition: A | Discharge status: Home / Self Care | Payer: Medicare Other | Source: Ambulatory Visit | Attending: Family Medicine | Admitting: Family Medicine

## 2022-05-21 ENCOUNTER — Ambulatory Visit: Payer: Medicare Other | Admitting: Neurology

## 2022-05-21 ENCOUNTER — Telehealth: Payer: Medicare Other

## 2022-05-21 ENCOUNTER — Encounter (HOSPITAL_BASED_OUTPATIENT_CLINIC_OR_DEPARTMENT_OTHER): Payer: Self-pay

## 2022-05-21 DIAGNOSIS — R109 Unspecified abdominal pain: Secondary | ICD-10-CM | POA: Diagnosis not present

## 2022-05-21 DIAGNOSIS — R1031 Right lower quadrant pain: Secondary | ICD-10-CM | POA: Diagnosis not present

## 2022-05-21 DIAGNOSIS — R112 Nausea with vomiting, unspecified: Secondary | ICD-10-CM | POA: Diagnosis not present

## 2022-05-21 DIAGNOSIS — R197 Diarrhea, unspecified: Secondary | ICD-10-CM | POA: Insufficient documentation

## 2022-05-21 MED ORDER — IOHEXOL 300 MG/ML  SOLN
100.0000 mL | Freq: Once | INTRAMUSCULAR | Status: AC | PRN
  Administered 2022-05-21: 100 mL via INTRAVENOUS

## 2022-05-21 NOTE — Progress Notes (Signed)
No acute findings to explain your symptoms. There is some constipation - make sure you are staying well hydrated, can try MiraLax. There was a small area of concern on your liver that we should follow-up on when you are feeling better. Since you are seeing GI in a few weeks, I would definitely mention this to them and get their opinion. They may go ahead and order a follow-up MRI for you, if not, you can discuss with Dr. Drue Novel if you would like to proceed with further investigation once you are feeling a little bit better.

## 2022-05-22 ENCOUNTER — Ambulatory Visit (INDEPENDENT_AMBULATORY_CARE_PROVIDER_SITE_OTHER): Payer: Medicare Other | Admitting: Pharmacist

## 2022-05-22 DIAGNOSIS — E871 Hypo-osmolality and hyponatremia: Secondary | ICD-10-CM

## 2022-05-22 DIAGNOSIS — K219 Gastro-esophageal reflux disease without esophagitis: Secondary | ICD-10-CM

## 2022-05-22 DIAGNOSIS — K22719 Barrett's esophagus with dysplasia, unspecified: Secondary | ICD-10-CM

## 2022-05-22 DIAGNOSIS — E559 Vitamin D deficiency, unspecified: Secondary | ICD-10-CM

## 2022-05-22 DIAGNOSIS — E785 Hyperlipidemia, unspecified: Secondary | ICD-10-CM

## 2022-05-22 DIAGNOSIS — I10 Essential (primary) hypertension: Secondary | ICD-10-CM

## 2022-05-22 DIAGNOSIS — F32A Depression, unspecified: Secondary | ICD-10-CM

## 2022-05-24 ENCOUNTER — Encounter: Payer: Self-pay | Admitting: Neurology

## 2022-05-24 NOTE — Patient Instructions (Signed)
Ms. Blackard I was so glad to speak with you today.  Below is a summary of your health goals and our recent visit. You can also view your update Chronic Care Management Care plan through your MyChart account.   Patient Goals/Self-Care Activities:  take medications as prescribed,  focus on medication adherence by filling medications on time,  check blood pressure 1 to 2 times per week, document, and provide at future appointments,  engage in dietary modifications by limiting intake of fried foods and saturated fat consider aquatic physical therapy  Complete 12 weeks of vitamin D 50,000 units weekly and continue vitamin D over-the-counter 2000 units daily   Follow Up Plan: Telephone follow up appointment with care management team member scheduled for:  3 months    As always if you have any questions or concerns especially regarding medications, please feel free to contact me either at the phone number below or with a MyChart message.   Keep up the good work!  Henrene Pastor, PharmD Clinical Pharmacist [facility] [phone] (direct line)  [phone] (main office number)   Patient verbalizes understanding of instructions and care plan provided today and agrees to view in MyChart. Active MyChart status and patient understanding of how to access instructions and care plan via MyChart confirmed with patient.

## 2022-05-24 NOTE — Chronic Care Management (AMB) (Signed)
Chronic Care Management Pharmacy Note  05/22/2022 Name:  Madeline Daniel MRN:  [id number] DOB:  [birth year]  Summary: Reviewed refill history and adherence.  Patient asked about medications that could be cause of chronic hyponatremia. Duloxetine and hydrochlorothiazide have been associated with hyponatremia. She is taking duloxetine 62m + 610mdaily and hydrochlorothiazide 12.78m84maily. Last office blood pressure was elevated. Patient denies edema.  Will discussed with patient's PCP about possibly lowering dose of duloxetine or holding hydrochlorothiazide to see if sodium improves.  Also noted patient has referred to GI for dysphagia. She started alendronate in January 2023. Alendronate can worsen dysphagia. Discussed with patient and she does not feel that dysphagia is related to alendronate but will discuss with GI when she sees them next month.      Subjective: Madeline Daniel an [age]o. year old female who is a primary patient of Paz, JosAlda BertholdD.  The CCM team was consulted for assistance with disease management and care coordination needs.    Engaged with patient by telephone for follow up visit in response to provider referral for pharmacy case management and/or care coordination services.   Consent to Services:  The patient was given information about Chronic Care Management services, agreed to services, and gave verbal consent prior to initiation of services.  Please see initial visit note for detailed documentation.   Patient Care Team: PazColon BranchD as PCP - General (Internal Medicine) EckCherre RobinsPHEast Ithacaharmacist)  Recent Office Visits:  05/19/2022 - FamHeriberto Antiguad (BeOlevia BowensP) Seen for right lower abdominal pain and vertigo. Prescribed meclizine. Ordered CBC, CMP and CT of abdomen. Labs showed low sodium and elevated WBC.  04/07/2022 - Fam Med (BeOlevia BowensP) Seen for right eat pain. Prescribed Augmentin 8778m44mice a day for 7 days and flonse nasal spray.  03/24/2022 - Int  Med (Dr Paz)Larose Kellsnual PE. C/O fatigue, dysphagia,diplopia and inbalance. Labs checked. Referred to GI for possible repeat esophageal dilation for dysphagia. Referred to physical therapy and neurology as well.  Noted to have low vitamin D - recommended 12 more weeks of vitamin D 50,000 units weekly. 02/10/2022 - Int Med (Dr Paz)Video Visit for  Bronchospasm.  Recent surgery for carpel tunnel.  Refilled albuterol inhaler. If not better, call office and will prescribe prednisone 01/20/2022 - Int Med (Dr Paz)Larose Kellsen for acute sinusitis. Prescribed amoxicillin 8778mg51mce a day. Also recommended OTC Flonsae, Astepro nasal sprays.  Recent Consults:  05/04/2022 - Neurology (Dr Yan) Krista Bluen for gait abnormality. Referred to physical therapy. Checking MRI of brain due to right hearing loss and diplodia. For tremor - continue primidone 100mg 78medtime and added interal 10 to 20mg a62meded.  03/25/2022 - Ortho (Dr Spears)Greta Doomost surgery for right carpel tunnel surgery (6.5 weeks ago). Also having left tirgger finger of ring finger. Given cortisone injection to left ring trigger finger.  02/19/2022 - Ortho (Dr Spears Greta Doomow up post surgery for right carpal tunnel surgery. 02/02/2022 - Ortho Surgery (Dr Spears)Greta Doomfor neck pain and carpel tunnel syndrome.  01/23/2022 - Ortho (Dr Ramos) Nelva Bushnt Hospitalization: 12/11/2021 to 12/12/2021 [facility] [place] and [place].  Medications started at discharge: tessalon perles 100mg 3 59ms a day as needed for cough up to 7 days. Aerochamber spacer. ondansetro 4mg ever72m ours for 5 days.   Social History:  Patient has been living with her daughter,Madeline Daniel in [place] since [place]/2022. Patient is  originally from [place]. Patient is widowed and retired.   Objective:  Lab Results  Component Value Date   CREATININE 0.94 05/19/2022   CREATININE 0.94 03/24/2022   CREATININE 1.04 11/03/2021    Lab Results  Component Value  Date   HGBA1C 6.0 03/24/2022   Last diabetic Eye exam: No results found for: "HMDIABEYEEXA"  Last diabetic Foot exam: No results found for: "HMDIABFOOTEX"      Component Value Date/Time   CHOL 178 03/24/2022 1553   TRIG 138.0 03/24/2022 1553   HDL 77.50 03/24/2022 1553   CHOLHDL 2 03/24/2022 1553   VLDL 27.6 03/24/2022 1553   LDLCALC 73 03/24/2022 1553       Latest Ref Rng & Units 05/19/2022    1:50 PM 03/24/2022    3:53 PM 07/01/2021    2:48 PM  Hepatic Function  Total Protein 6.0 - 8.3 g/dL 7.8  7.4    7.5  7.5   Albumin 3.5 - 5.2 g/dL 4.1  3.9  3.8   AST 0 - 37 U/L _0 ALT 0 - 35 U/L _1 Alk Phosphatase 39 - 117 U/L 104  94  105   Total Bilirubin 0.2 - 1.2 mg/dL 0.8  0.5  0.5     Lab Results  Component Value Date/Time   TSH 2.24 03/24/2022 03:53 PM   TSH 2.53 07/01/2021 02:48 PM       Latest Ref Rng & Units 05/19/2022    1:50 PM 03/24/2022    3:53 PM 07/01/2021    2:48 PM  CBC  WBC 4.0 - 10.5 K/uL 10.9  8.0  8.8   Hemoglobin 12.0 - 15.0 g/dL 14.0  13.2  13.7   Hematocrit 36.0 - 46.0 % 40.7  39.1  40.5   Platelets 150.0 - 400.0 K/uL 276.0  268.0  282.0     Lab Results  Component Value Date/Time   VD25OH 21.89 (L) 03/24/2022 03:53 PM   VD25OH 8.13 (L) 11/03/2021 02:31 PM    Clinical ASCVD: Yes  The ASCVD Risk score (Arnett DK, et al., 2019) failed to calculate for the following reasons:   The 2019 ASCVD risk score is only valid for ages 28 to 59    DEXA 09/08/2021: Femur Neck Right  T-score = -2.6.  AP Spine L1-L4 T-score = -0.2  This patient is considered osteoporotic according to [facility]) criteria. The scan quality is good.  Social History   Tobacco Use  Smoking Status Never  Smokeless Tobacco Never   BP Readings from Last 3 Encounters:  05/19/22 123/68  05/04/22 (!) 153/83  04/07/22 (!) 112/51   Pulse Readings from Last 3 Encounters:  05/19/22 79  05/04/22 80  04/07/22 72   Wt Readings from Last 3  Encounters:  05/19/22 209 lb 12.8 oz (95.2 kg)  05/04/22 211 lb (95.7 kg)  04/07/22 212 lb 9.6 oz (96.4 kg)    Assessment: Review of patient past medical history, allergies, medications, health status, including review of consultants reports, laboratory and other test data, was performed as part of comprehensive evaluation and provision of chronic care management services.   SDOH:  (Social Determinants of Health) assessments and interventions performed:  SDOH Interventions    Flowsheet Row Most Recent Value  SDOH Interventions   Financial Strain Interventions Intervention Not Indicated  Physical Activity Interventions Other (Comments)  [patient started physical therapy but due to other illnesses recently it is currently  on hold]  Transportation Interventions Intervention Not Indicated  [patient has family that provides transportation.]        CCM Care Plan  No Known Allergies  Medications Reviewed Today     Reviewed by Cherre Robins, RPH-CPP (Pharmacist) on 05/22/22 at [place] List Status: <None>   Medication Order Taking? Sig Documenting Provider Last Dose Status Informant  albuterol (VENTOLIN HFA) 108 (90 Base) MCG/ACT inhaler [id number] Yes Inhale 1-2 puffs into the lungs every 6 (six) hours as needed for wheezing or shortness of breath. Colon Branch, MD Taking Active   alendronate (FOSAMAX) 70 MG tablet [id number] Yes Take 1 tablet (70 mg total) by mouth once a week. Take with a full glass of water on an empty stomach. Remain sitting upright for 45-60 mins Paz, Alda Berthold, MD Taking Active   ALPRAZolam Duanne Moron) 0.5 MG tablet [id number] Yes Take 0.5-1 tablets (0.25-0.5 mg total) by mouth daily as needed for anxiety. Colon Branch, MD Taking Active   amLODipine (NORVASC) 5 MG tablet [id number] Yes Take 5 mg by mouth daily. [provider] Taking Active   atorvastatin (LIPITOR) 40 MG tablet [id number] Yes Take 40 mg by mouth daily. [provider] Taking Active    Cholecalciferol (VITAMIN D3) 50 MCG (2000 UT) capsule [id number] Yes Take 1 capsule (2,000 Units total) by mouth daily. Colon Branch, MD Taking Active   DULoxetine (CYMBALTA) 30 MG capsule [id number] Yes Take 1 capsule (30 mg total) by mouth daily. Take with 48m capsule to equal 923mdaily PaColon BranchMD Taking Active   DULoxetine (CYMBALTA) 60 MG capsule [id number] Take 1 capsule (60 mg total) by mouth daily. Take with 3024mapsule to equal 90 mg daily Paz, JosAlda BertholdD Taking Active   folic acid (FOLVITE) 0.5 MG tablet [id number] Take 1 mg by mouth daily. [provider] Taking Active   hydrochlorothiazide (HYDRODIURIL) 12.5 MG tablet [id number] Take 12.5 mg by mouth daily. [provider] Taking Active   lisinopril (ZESTRIL) 40 MG tablet [id number] Take 40 mg by mouth daily. [provider] Taking Active   meclizine (ANTIVERT) 25 MG tablet [id number] Take 1 tablet (25 mg total) by mouth 3 (three) times daily as needed for dizziness. BecTerrilyn SaverP Taking Active   metoprolol succinate (TOPROL-XL) 25 MG 24 hr tablet [id number] TAKE 1 TABLET BY MOUTH DAILY  WITH A MEAL OR IMMEDIATELY  FOLLOWING A MEAL Paz, JosAlda BertholdD Taking Active   naloxone (NAStephens Memorial Hospitalasal spray 4 mg/0.1 mL [id number] Place 1 spray into the nose. As directed  Patient not taking: Reported on 02/10/2022   PazColon BranchD Not Taking Active   omeprazole (PRILOSEC) 20 MG capsule [id number] Take 20 mg by mouth 2 (two) times daily before a meal. [provider] Taking Active   ondansetron (ZOFRAN) 4 MG tablet [id number] Take 1 tablet (4 mg total) by mouth every 8 (eight) hours as needed for nausea or vomiting. PazColon BranchD Taking Active   primidone (MYSOLINE) 50 MG tablet [id number] Take 100 mg by mouth at bedtime. [provider] Taking Active   propranolol (INDERAL) 20 MG tablet [id number] Take 1 tablet (20 mg total) by mouth 3 (three) times daily as needed.  YanMarcial PacasD Taking Active   Vitamin D, Ergocalciferol, (DRISDOL) 1.25 MG (50000 UNIT) CAPS capsule [id number] Take 1 capsule (50,000 Units total) by mouth every 7 (seven) days. 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   Diplopia 05/04/2022   Annual physical exam 03/24/2022   Hypertension, essential 08/07/2021   Hyperlipidemia 08/07/2021   Anxiety and depression 08/07/2021   Chronic nonalcoholic liver disease 24/23/5361   GERD (gastroesophageal reflux disease) 08/07/2021   Meniere's disease 08/07/2021   CKD (chronic kidney disease), stage III ([place]) 08/07/2021   Severe depression ([place]) 08/07/2021   Atherosclerosis of aorta ([place]) 08/07/2021   Chronic pain syndrome 08/07/2021   Barrett's esophagus 08/07/2021   Peptic ulcer disease 08/07/2021   Primary osteoarthritis involving multiple joints 07/02/2021   PCP NOTES >>>>>>>>>>>>>>> 07/01/2021   Vertigo 10/17/2019   Essential tremor 10/17/2019   Abnormal gait 10/17/2019   Abnormal MRI of head 10/17/2019   Ocular hypertension, 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   Dry eye syndrome, bilateral 01/07/2018   PVD (posterior vitreous detachment) 11/18/2015   PCO (posterior capsular opacification) 11/18/2015   Degenerative retinal drusen of both eyes 11/18/2015   Biliary dyskinesia 04/04/2015    Immunization History  Administered Date(s) Administered   Fluad Quad(high Dose 65+) 09/01/2021   Moderna Covid-19 Vaccine Bivalent Booster 24yr & up 09/08/2021   Moderna Sars-Covid-2 Vaccination 11/15/2019, 12/13/2019, 08/26/2020, 02/13/2021   PNEUMOCOCCAL CONJUGATE-20 03/24/2022   Pneumococcal Polysaccharide-23 12/02/2020   Tdap 05/15/2021    Conditions to be addressed/monitored: CAD, HTN, HLD, CKD Stage 3, Anxiety, Depression, GERD, Osteoporosis, and Barrett's esophagus, low serum vitamin D; chronic pain;  essential tremor; hyponatremia;   Care Plan : General Pharmacy (Adult)  Updates made by ECherre Robins RPH-CPP since 05/24/2022 12:00 AM     Problem: Chronic Conditions: HDL; HTN; atherosclerosis of aorta; hyponatremia; chronic pain; low vitamin D; essential tremor; pre diabetes; GERD with Barrett's esophagus and history of ulcer; anxiety with depression; NASH; HTN   Priority: High  Onset Date: 11/20/2021     Long-Range Goal: Provide education, support and care coordination for medication therapy and chronic conditions   Start Date: 11/20/2021  Priority: High  Note:   Current Barriers:  Unable to achieve control of hyponatremia  Taking high risk medications per BWhole Foodslist - monitoring  Not taking medications as prescribed - improving  Pharmacist Clinical Goal(s):  Over the next 90 days, patient will achieve adherence to monitoring guidelines and medication adherence to achieve therapeutic efficacy achieve control of pain  as evidenced by increased activity and patient self report of improved pain level maintain control of hypertension and prediabetes as evidenced by achieving goal mentioned below  adhere to prescribed medication regimen as evidenced by refill history   through collaboration with PharmD and provider.   Interventions: 1:1 collaboration with PColon Branch MD regarding development and update of comprehensive plan of care as evidenced by provider attestation and co-signature Inter-disciplinary care team collaboration (see longitudinal plan of care) Comprehensive medication review performed; medication list updated in electronic medical record  Chronic Pain Syndrome:  Goal: decrease pain and improve activity Currently therapy:  Tramadol 570monce a day as needed Duloxetine 3036m 79m12mpules daily to equal a total daily dose of 90mg29mvious medications: gabapentin and hydrocodone/acetaminophen Patient has been seeing Dr RamosNelva BushCarolLevy Pupafor epidural and nerve  blocks Reports pain has improved.  Interventions:  Continue to take duloxetine each morning Continue to follow up with Dr RamosNelva BushCarolLevy Pupa  Essential Tremor:  Goal: decrease symptoms of essential tremor  Seeing neurologist.  Currently therapy:  Primidone 34m - take 2 tablets = 1068mat bedtime.  Propranolol 2048mp to 3 times a day as needed.  Urine drug screen 11/03/2021 was positive for phenobarbital. Primidone is metabolized to phenobarbital, therefore phenobarbital would be expected in a urine test of a patient taking primidone.  Interventions:  Provided education to patient and patients daughter regarding recent drug screen and results. Explained that results was expected (addressed at previous visit) Consulted with PCP regarding phenobarbital being a metabolite of primidone.   Osteoporosis / Low Vitamin D:  Goal: Prevent fractures and achieve therapeutic levels of serum vitamin D.  DEXA 09/08/2021: Femur Neck Right  T-score = -2.6.  AP Spine L1-L4 T-score = -0.2   Lab Results  Component Value Date   VD25OH 21.89 (L) 03/24/2022  Current therapy:  Alendronate 2m28mtake 1 tablet once a week on an empty stomach, prior to eating or taking other medications; Take with a full glass (at least 4 ounces of water) Do not eat, drink, lie down or recline for 30 minutes after taking alendronate.  Vitamin D 50,000 units weekly for 12 weeks and vitamin D 2000 units daily.  Patient is using walker when ambulating Interventions:  Also discussed vitamin D supplementation. On second round of weekly vitamin D and is taking vitamin D 2000  Discussed Physical therapy - see above Will try to minimize medications that can increase risk of fall   Hypertension: Controlled; blood pressure goal 140/90 Current treatment: Lisinopril 40mg78mly Hydrochlorothiazide 25mg 56my  Amlodipine 5mg da76m  Metoprolol succinate ER 25mg da29m  Current home readings: none reported today Denies  hypotensive/hypertensive symptoms Interventions:  Discussed blood pressure goal Recommended continue current therapy  Hyperlipidemia: Uncontrolled; LDL goal <70 (due to atherosclerosis of aorta) current treatment: Atorvastatin 40mg dai64mCurrent dietary patterns:  tries to limit high fat and fried foods Interventions:  Continue to take atorvastatin 40mg dail43mry close to LDL goal - continue to limit saturated fat intake.   Hyponatremia, chronic:  Not at goal; Goal: serum sodium 135 to 145 Interventions:  Reviewed medication list for possible causes of low sodium  Hydrochlorothiazide and duloxetine have been associated with low sodium Will consult with PCP about possibly holding hydrochlorothiazide or lowering dose of duloxetine to see if sodium improves.   Medication management Pharmacist Clinical Goal(s): Over the next 90 days, patient will work with PharmD and providers to maintain optimal medication adherence [facility] Interventions Comprehensive medication review performed. Reviewed refill history and assessed adherence [facility] management strategy Patient self care activities - Over the next 90 days, patient will: Focus on medication adherence by filling medications appropriately  Take medications as prescribed Report any questions or concerns to PharmD and/or provider(s)  Patient Goals/Self-Care Activities Over the next 90 days, patient will:  take medications as prescribed,  focus on medication adherence by filling medications on time,  check blood pressure 1 to 2 times per week, document, and provide at future appointments,  engage in dietary modifications by limiting intake of fried foods and saturated fat consider aquatic physical therapy  Complete 12 weeks of vitamin D 50,000 units weekly and continue vitamin D over-the-counter 2000 units daily   Follow Up Plan: Telephone follow up  appointment with care management team member scheduled for:  3 months  Medication Assistance: None required.  Patient affirms current coverage meets needs.  Patient's preferred pharmacy is:  [facility] ([facility] - [address], [address] [place] [address] Phone: [phone] Fax: [phone]  [id number] [address]Phone: [phone]: [phone]  Follow Up:  Patient agrees to Care Plan and Follow-up.  Plan: Telephone follow up appointment with care management team member scheduled for:  2 to 3 months  TCherre Robins PharmD Clinical Pharmacist [facility]

## 2022-05-25 DIAGNOSIS — I10 Essential (primary) hypertension: Secondary | ICD-10-CM

## 2022-05-25 DIAGNOSIS — F32A Depression, unspecified: Secondary | ICD-10-CM | POA: Diagnosis not present

## 2022-05-25 DIAGNOSIS — F419 Anxiety disorder, unspecified: Secondary | ICD-10-CM | POA: Diagnosis not present

## 2022-05-25 DIAGNOSIS — E785 Hyperlipidemia, unspecified: Secondary | ICD-10-CM

## 2022-05-26 DIAGNOSIS — M5412 Radiculopathy, cervical region: Secondary | ICD-10-CM | POA: Diagnosis not present

## 2022-05-26 DIAGNOSIS — Z5181 Encounter for therapeutic drug level monitoring: Secondary | ICD-10-CM | POA: Diagnosis not present

## 2022-05-28 ENCOUNTER — Telehealth: Payer: Self-pay | Admitting: Internal Medicine

## 2022-05-28 DIAGNOSIS — I10 Essential (primary) hypertension: Secondary | ICD-10-CM

## 2022-05-28 NOTE — Telephone Encounter (Signed)
Tammy, you suggested suggested to decrease Cymbalta to 60 mg and hold hydrochlorothiazide 12.5 mg to see if hyponatremia improve.  I agree, lets do that, also please  advise the patient to monitor BPs at home and call if they are increasing.  Kaylyn, please enter a BMP to be done in 4 weeks.

## 2022-05-29 NOTE — Telephone Encounter (Signed)
Attempted to call patient to discuss recommended medication changes but she was not awake yet. Will call back later today.

## 2022-05-29 NOTE — Telephone Encounter (Signed)
BMP ordered

## 2022-05-29 NOTE — Telephone Encounter (Signed)
Patient notified to hold hydrochlorothiazide 12.5mg  and duloxetine 30mg . She will continue duloxetine 60mg  daily.  Recheck BMP / CMP - appt 06/15/2022.  Patient instructed to monitor blood pressure, for edema and for changes in mood and to notify office if blood pressure > 140/90 or she experiences edema or mood changes

## 2022-06-04 ENCOUNTER — Ambulatory Visit: Payer: Medicare Other | Admitting: Nurse Practitioner

## 2022-06-05 ENCOUNTER — Telehealth: Payer: Self-pay | Admitting: Gastroenterology

## 2022-06-05 NOTE — Telephone Encounter (Signed)
Please schedule next available clinic visit with myself or one of the APP's (they can staff with me).  Looks like there was a referral back in May and the patient declined on scheduling a clinic visit on 05/29/2022.  Would reach out to patient and see what she wants. Thanks. GM

## 2022-06-05 NOTE — Telephone Encounter (Signed)
Good Morning Dr Meridee Score,  Supervising MD 03/24/22 PM.  Received referral from patients primary care for patient to be seen for Barrett's Esophagus with Dysphagia. Patient last seen with [facility] for procedure in June of 2021. Records are available in epics for review.  Please review and advise on scheduling.  Thank you

## 2022-06-12 ENCOUNTER — Ambulatory Visit: Payer: Medicare Other | Admitting: Nurse Practitioner

## 2022-06-15 ENCOUNTER — Other Ambulatory Visit (INDEPENDENT_AMBULATORY_CARE_PROVIDER_SITE_OTHER): Payer: Medicare Other

## 2022-06-15 DIAGNOSIS — I10 Essential (primary) hypertension: Secondary | ICD-10-CM | POA: Diagnosis not present

## 2022-06-16 ENCOUNTER — Encounter: Payer: Self-pay | Admitting: Internal Medicine

## 2022-06-16 LAB — BASIC METABOLIC PANEL
BUN: 15 mg/dL (ref 6–23)
CO2: 27 mEq/L (ref 19–32)
Calcium: 9.1 mg/dL (ref 8.4–10.5)
Chloride: 90 mEq/L — ABNORMAL LOW (ref 96–112)
Creatinine, Ser: 0.93 mg/dL (ref 0.40–1.20)
GFR: 56.93 mL/min — ABNORMAL LOW (ref >60.00)
Glucose, Bld: 122 mg/dL — ABNORMAL HIGH (ref 70–99)
Potassium: 4.2 mEq/L (ref 3.5–5.1)
Sodium: 129 mEq/L — ABNORMAL LOW (ref 135–145)

## 2022-07-21 DIAGNOSIS — H52223 Regular astigmatism, bilateral: Secondary | ICD-10-CM | POA: Diagnosis not present

## 2022-07-21 DIAGNOSIS — H5203 Hypermetropia, bilateral: Secondary | ICD-10-CM | POA: Diagnosis not present

## 2022-07-21 DIAGNOSIS — H524 Presbyopia: Secondary | ICD-10-CM | POA: Diagnosis not present

## 2022-07-21 DIAGNOSIS — H40053 Ocular hypertension, bilateral: Secondary | ICD-10-CM | POA: Diagnosis not present

## 2022-07-21 DIAGNOSIS — H02055 Trichiasis without entropian left lower eyelid: Secondary | ICD-10-CM | POA: Diagnosis not present

## 2022-07-21 DIAGNOSIS — H35371 Puckering of macula, right eye: Secondary | ICD-10-CM | POA: Diagnosis not present

## 2022-07-21 DIAGNOSIS — H43813 Vitreous degeneration, bilateral: Secondary | ICD-10-CM | POA: Diagnosis not present

## 2022-07-21 DIAGNOSIS — Z961 Presence of intraocular lens: Secondary | ICD-10-CM | POA: Diagnosis not present

## 2022-08-06 ENCOUNTER — Other Ambulatory Visit: Payer: Self-pay | Admitting: Internal Medicine

## 2022-08-17 ENCOUNTER — Ambulatory Visit: Payer: Medicare Other | Admitting: Internal Medicine

## 2022-08-17 ENCOUNTER — Ambulatory Visit (INDEPENDENT_AMBULATORY_CARE_PROVIDER_SITE_OTHER): Payer: Medicare Other | Admitting: Internal Medicine

## 2022-08-17 ENCOUNTER — Encounter: Payer: Self-pay | Admitting: Internal Medicine

## 2022-08-17 VITALS — BP 142/84 | HR 97 | Temp 98.0°F | Resp 18 | Ht 59.0 in | Wt 212.0 lb

## 2022-08-17 DIAGNOSIS — R739 Hyperglycemia, unspecified: Secondary | ICD-10-CM

## 2022-08-17 DIAGNOSIS — J069 Acute upper respiratory infection, unspecified: Secondary | ICD-10-CM

## 2022-08-17 DIAGNOSIS — E871 Hypo-osmolality and hyponatremia: Secondary | ICD-10-CM

## 2022-08-17 DIAGNOSIS — R7989 Other specified abnormal findings of blood chemistry: Secondary | ICD-10-CM | POA: Diagnosis not present

## 2022-08-17 DIAGNOSIS — F419 Anxiety disorder, unspecified: Secondary | ICD-10-CM | POA: Diagnosis not present

## 2022-08-17 DIAGNOSIS — F32A Depression, unspecified: Secondary | ICD-10-CM

## 2022-08-17 DIAGNOSIS — Z79899 Other long term (current) drug therapy: Secondary | ICD-10-CM

## 2022-08-17 DIAGNOSIS — E538 Deficiency of other specified B group vitamins: Secondary | ICD-10-CM | POA: Diagnosis not present

## 2022-08-17 DIAGNOSIS — R7983 Abnormal findings of blood amino-acid level: Secondary | ICD-10-CM | POA: Diagnosis not present

## 2022-08-17 NOTE — Patient Instructions (Addendum)
For your cold: Rest, fluids, Tylenol. Flonase or similar nose spray daily Robitussin-DM Call if not gradually better in the next 5 to 7 days. Seek medical attention if severe symptoms  Once you feel better proceed with your vaccines including flu shot, COVID booster.   Check the  blood pressure regularly BP GOAL is between 110/65 and  135/85. If it is consistently higher or lower, let me know    GO TO THE LAB : Get the blood work     [place], [place] back for   a checkup in 6 weeks

## 2022-08-17 NOTE — Progress Notes (Signed)
Subjective:    Patient ID: Madeline Daniel, female    DOB: Oct 12, 1941, 81 y.o.   MRN: [id number]  DOS:  08/17/2022 Type of visit - description: Follow-up, here with her daughter  We reviewed her chronic medical problems. For hyponatremia, HCTZ held, Cymbalta dose reduced, she likes to go back on Cymbalta 90 mg and self restarted HCTZ due to lower extremity edema.  Also not feeling well for 1 week: Sore throat, chest and sinus congestion, cough with yellowish sputum. Denies fever or chills.  Taking Mucinex.  Chronic DOE is slightly worse than usual since this started. COVID test x2 negative.  Review of Systems See above   Past Medical History:  Diagnosis Date   Anxiety    Arthritis    Atherosclerosis of aorta (HCC)    Barrett's esophagus    Bleeding ulcer 2005   CKD (chronic kidney disease), stage III (HCC)    Depression    Essential tremor    Gait disorder    GERD (gastroesophageal reflux disease)    History of rib fracture    multiple   History of stomach ulcers    Hyperlipidemia    Hypertension    Insomnia    Meniere disease    Nonalcoholic liver disease, chronic    Peptic ulcer disease    TIA (transient ischemic attack)    Vertigo     Past Surgical History:  Procedure Laterality Date   CARPAL TUNNEL RELEASE Right 02/06/2022   CATARACT EXTRACTION Bilateral    CHOLECYSTECTOMY     REPLACEMENT TOTAL KNEE BILATERAL Bilateral     Current Outpatient Medications  Medication Instructions   albuterol (VENTOLIN HFA) 108 (90 Base) MCG/ACT inhaler 1-2 puffs, Inhalation, Every 6 hours PRN   alendronate (FOSAMAX) 70 mg, Oral, Weekly, Take with a full glass of water on an empty stomach. Remain sitting upright for 45-60 mins   ALPRAZolam (XANAX) 0.25-0.5 mg, Oral, Daily PRN   amLODipine (NORVASC) 5 mg, Oral, Daily   atorvastatin (LIPITOR) 40 mg, Oral, Daily   DULoxetine (CYMBALTA) 30 MG capsule TAKE 1 CAPSULE BY MOUTH DAILY  WITH 60 MG CAPSULE FOR A TOTAL  DAILY DOSE OF  90 MG   DULoxetine (CYMBALTA) 60 MG capsule TAKE 1 CAPSULE BY MOUTH DAILY  TAKE WITH 30 MG CAPSULE TO EQUAL 90 MG DAILY   folic acid (FOLVITE) 1 mg, Oral, Daily   hydrochlorothiazide (HYDRODIURIL) 12.5 mg, Oral, Daily   lisinopril (ZESTRIL) 40 mg, Oral, Daily   meclizine (ANTIVERT) 25 mg, Oral, 3 times daily PRN   metoprolol succinate (TOPROL-XL) 25 MG 24 hr tablet TAKE 1 TABLET BY MOUTH DAILY  WITH A MEAL OR IMMEDIATELY  FOLLOWING A MEAL   naloxone (NARCAN) nasal spray 4 mg/0.1 mL 1 spray   omeprazole (PRILOSEC) 20 mg, Oral, 2 times daily before meals   ondansetron (ZOFRAN) 4 mg, Oral, Every 8 hours PRN   primidone (MYSOLINE) 100 mg, Oral, Daily at bedtime   propranolol (INDERAL) 20 mg, Oral, 3 times daily PRN   Vitamin D (Ergocalciferol) (DRISDOL) 50,000 Units, Oral, Every 7 days   Vitamin D3 2,000 Units, Oral, Daily       Objective:   Physical Exam BP (!) 142/84   Pulse 97   Temp 98 F (36.7 C) (Oral)   Resp 18   Ht 4\' 11"  (1.499 m)   Wt 212 lb (96.2 kg)   SpO2 94%   BMI 42.82 kg/m  General:   Well developed, NAD, BMI noted. HEENT:  Normocephalic . Face symmetric, atraumatic.  Throat symmetric and not red. Nose slightly congested Lungs:  CTA B Normal respiratory effort, no intercostal retractions, no accessory muscle use. Heart: RRR,  no murmur.  Lower extremities: no pretibial edema bilaterally  Skin: Not pale. Not jaundice Neurologic:  alert & oriented X3.  Speech normal, gait appropriate for age and unassisted Psych--  Cognition and judgment appear intact.  Cooperative with normal attention span and concentration.  Behavior appropriate. No anxious or depressed appearing.      Assessment     Assessment (new patient, referred by her daughter Madeline Daniel) Hyperglycemia HTN High cholesterol Anxiety depression GI: -GERD, h/o esophageal dilatations , Barrett's  (see GI note 03/26/2020) -EGD:  04/02/2020: Normal duodenum, suggestion of Barrett's esophagus,  hiatal hernia (dilatation), polyp stomach. Pathology not available -Question of liver cirrhosis, incidental finding on CTA 11/05/2019. NEURO -Essential Tremor, onset age 13s  on primidone -Brain MRI-- volume loss and small vessel ischemic disease. -H./o TIA  -Menire, lack of balance, zofran prn -Gait d/o: due to dizziness >> DJD; uses a walker, h/o frequent fall Bronchospasm  DJD knee-hands-neck , tramadol prn Vitamin D deficiency Social: Move with her daughter Madeline Daniel from [place], plans to live in [place] during the winters   PLAN:  Hyperglycemia: Check A1c Hyponatremia: Possibly related to Cymbalta and HCTZ. Sodium slightly better after HCTZ held and cymbalta dose  decreased. The patient however restarted HCTZ due to LE edema ; also  likes Cymbalta dose increase, having some depression. Plan: Continue HCTZ, increase Cymbalta to 90 mg, we will have to monitor hyponatremia closely.  Consider further evaluation.  Consider stopped amlodipine and HCTZ and switch to another BP agent. Depression, anxiety: Okay to go back on Cymbalta 90 mg daily.  The family found a geriatric psychiatrist having plan to discuss this issue with them. Also on Xanax, UDS today Tremors, gait disorder, gait disorder, diplopia. H/o TIA: Saw neurology, work-up including a negative acetyl choline receptor antibodies ;  had a brain MRI, no acute changes, no new advice provided. H/o abnormal SPEP: Repeated SPEP normal Osteopenia: Per DEXA in November 2022, on alendronate and vitamin D supplements. B12 deficiency: Unknown to me, she was diagnosed before with the B12 deficiency, got shots at some point?.  Currently on no supplements.  Plan: Check J18, folic acid, homocystine level.  Further advise w/ results Vitamin D deficiency: On supplements URI: Conservative treatment for now. RTC 6 weeks

## 2022-08-18 ENCOUNTER — Telehealth: Payer: Self-pay | Admitting: Internal Medicine

## 2022-08-18 ENCOUNTER — Encounter: Payer: Self-pay | Admitting: Internal Medicine

## 2022-08-18 DIAGNOSIS — E871 Hypo-osmolality and hyponatremia: Secondary | ICD-10-CM

## 2022-08-18 LAB — HEMOGLOBIN A1C: Hgb A1c MFr Bld: 6 % (ref 4.6–6.5)

## 2022-08-18 LAB — BASIC METABOLIC PANEL
BUN: 18 mg/dL (ref 6–23)
CO2: 26 mEq/L (ref 19–32)
Calcium: 9.1 mg/dL (ref 8.4–10.5)
Chloride: 90 mEq/L — ABNORMAL LOW (ref 96–112)
Creatinine, Ser: 1.06 mg/dL (ref 0.40–1.20)
GFR: 48.6 mL/min — ABNORMAL LOW (ref >60.00)
Glucose, Bld: 132 mg/dL — ABNORMAL HIGH (ref 70–99)
Potassium: 4 mEq/L (ref 3.5–5.1)
Sodium: 125 mEq/L — ABNORMAL LOW (ref 135–145)

## 2022-08-18 LAB — B12 AND FOLATE PANEL
Folate: >23.8 ng/mL (ref >5.9)
Vitamin B-12: 194 pg/mL — ABNORMAL LOW (ref 211–911)

## 2022-08-18 LAB — HOMOCYSTEINE: Homocysteine: 16.9 umol/L — ABNORMAL HIGH (ref <10.4)

## 2022-08-18 MED ORDER — METOPROLOL SUCCINATE ER 50 MG PO TB24: 50.0000 mg | ORAL_TABLET | Freq: Every day | ORAL | 1 refills | Status: DC

## 2022-08-18 NOTE — Assessment & Plan Note (Signed)
Hyperglycemia: Check A1c Hyponatremia: Possibly related to Cymbalta and HCTZ. Sodium slightly better after HCTZ held and cymbalta dose  decreased. The patient however restarted HCTZ due to LE edema ; also  likes Cymbalta dose increase, having some depression. Plan: Continue HCTZ, increase Cymbalta to 90 mg, we will have to monitor hyponatremia closely.  Consider further evaluation.  Consider stopped amlodipine and HCTZ and switch to another BP agent. Depression, anxiety: Okay to go back on Cymbalta 90 mg daily.  The family found a geriatric psychiatrist having plan to discuss this issue with them. Also on Xanax, UDS today Tremors, gait disorder, gait disorder, diplopia. H/o TIA: Saw neurology, work-up including a negative acetyl choline receptor antibodies ;  had a brain MRI, no acute changes, no new advice provided. H/o abnormal SPEP: Repeated SPEP normal Osteopenia: Per DEXA in November 2022, on alendronate and vitamin D supplements. B12 deficiency: Unknown to me, she was diagnosed before with the B12 deficiency, got shots at some point?.  Currently on no supplements.  Plan: Check A12, folic acid, homocystine level.  Further advise w/ results Vitamin D deficiency: On supplements URI: Conservative treatment for now. RTC 6 weeks

## 2022-08-18 NOTE — Telephone Encounter (Signed)
Please call patient and/or her daughter: -Sodium is lower than before. - Recommend to stop HCTZ and amlodipine (she takes  HCTZ because amlodipine is causing swelling). -Increase metoprolol XL 25 mg to 50 mg daily to compensate her BP.  Monitor BPs at home.  Call if more than 150/85. -B12 is low, recommend B12 shot weekly x4 then monthly. - BMP in 2 weeks

## 2022-08-18 NOTE — Telephone Encounter (Signed)
Spoke w/ Abigail Butts- Pt's daughter, informed of results and recommendations. Abigail Butts verbalized understanding. Nurse visit scheduled for first b12 injection and lab appt scheduled for 2 weeks to recheck BMP (order placed). Metoprolol xl 50mg  sent to mail order as requested.

## 2022-08-19 ENCOUNTER — Encounter: Payer: Self-pay | Admitting: Internal Medicine

## 2022-08-19 DIAGNOSIS — F419 Anxiety disorder, unspecified: Secondary | ICD-10-CM

## 2022-08-20 LAB — DRUG TOX MONITOR 1 W/CONF, ORAL FLD
Alprazolam: 0.64 ng/mL — ABNORMAL HIGH (ref <0.50)
Amobarbital: NEGATIVE ng/mL (ref <10)
Amphetamines: NEGATIVE ng/mL (ref <10)
Barbiturates: POSITIVE ng/mL — AB (ref <10)
Benzodiazepines: POSITIVE ng/mL — AB (ref <0.50)
Buprenorphine: NEGATIVE ng/mL (ref <0.10)
Butalbital: NEGATIVE ng/mL (ref <10)
Chlordiazepoxide: NEGATIVE ng/mL (ref <0.50)
Clonazepam: NEGATIVE ng/mL (ref <0.50)
Cocaine: NEGATIVE ng/mL (ref <5.0)
Diazepam: NEGATIVE ng/mL (ref <0.50)
Fentanyl: NEGATIVE ng/mL (ref <0.10)
Flunitrazepam: NEGATIVE ng/mL (ref <0.50)
Flurazepam: NEGATIVE ng/mL (ref <0.50)
Heroin Metabolite: NEGATIVE ng/mL (ref <1.0)
Lorazepam: NEGATIVE ng/mL (ref <0.50)
MARIJUANA: NEGATIVE ng/mL (ref <2.5)
MDMA: NEGATIVE ng/mL (ref <10)
Meprobamate: NEGATIVE ng/mL (ref <2.5)
Methadone: NEGATIVE ng/mL (ref <5.0)
Midazolam: NEGATIVE ng/mL (ref <0.50)
Nicotine Metabolite: NEGATIVE ng/mL (ref <5.0)
Nordiazepam: NEGATIVE ng/mL (ref <0.50)
Opiates: NEGATIVE ng/mL (ref <2.5)
Oxazepam: NEGATIVE ng/mL (ref <0.50)
Pentobarbital: NEGATIVE ng/mL (ref <10)
Phencyclidine: NEGATIVE ng/mL (ref <10)
Phenobarbital: 211 ng/mL — ABNORMAL HIGH (ref <10)
Secobarbital: NEGATIVE ng/mL (ref <10)
Tapentadol: NEGATIVE ng/mL (ref <5.0)
Temazepam: NEGATIVE ng/mL (ref <0.50)
Tramadol: NEGATIVE ng/mL (ref <5.0)
Triazolam: NEGATIVE ng/mL (ref <0.50)
Zolpidem: NEGATIVE ng/mL (ref <5.0)

## 2022-08-20 NOTE — Telephone Encounter (Signed)
Dx: Depression anxiety

## 2022-08-20 NOTE — Addendum Note (Signed)
Addended by: Damita Dunnings D on: 08/20/2022 11:11 AM   Modules accepted: Orders

## 2022-08-21 ENCOUNTER — Other Ambulatory Visit: Payer: Self-pay | Admitting: Internal Medicine

## 2022-08-21 ENCOUNTER — Encounter: Payer: Self-pay | Admitting: Internal Medicine

## 2022-08-21 MED ORDER — AMOXICILLIN 875 MG PO TABS: 875.0000 mg | ORAL_TABLET | Freq: Two times a day (BID) | ORAL | 0 refills | Status: DC

## 2022-08-25 ENCOUNTER — Ambulatory Visit (INDEPENDENT_AMBULATORY_CARE_PROVIDER_SITE_OTHER): Payer: Medicare Other

## 2022-08-25 DIAGNOSIS — E538 Deficiency of other specified B group vitamins: Secondary | ICD-10-CM | POA: Diagnosis not present

## 2022-08-25 MED ORDER — CYANOCOBALAMIN 1000 MCG/ML IJ SOLN
1000.0000 ug | Freq: Once | INTRAMUSCULAR | Status: AC
  Administered 2022-08-25: 1000 ug via INTRAMUSCULAR

## 2022-08-25 NOTE — Progress Notes (Signed)
Madeline Daniel is a 81 y.o. female presents to the office today for initial B12. She will be getting once a week for 4 weekls and then monthly injections, per physician's orders. Original order: on lab resutls from 08/17/22 to start B 12 shots.   (med), cyanocobalamin  (dose),  1000 mg/ml (route) was administered Im left deltoid (location) today. Patient tolerated injection.  Patient next injection due: in one week for 2 of 4 weekly B12, appt made Yes for 09/01/22.  Jiles Prows

## 2022-08-31 ENCOUNTER — Other Ambulatory Visit: Payer: Medicare Other

## 2022-08-31 ENCOUNTER — Encounter: Payer: Self-pay | Admitting: Internal Medicine

## 2022-08-31 ENCOUNTER — Other Ambulatory Visit: Payer: Self-pay | Admitting: Internal Medicine

## 2022-08-31 ENCOUNTER — Telehealth: Payer: Self-pay | Admitting: Internal Medicine

## 2022-08-31 MED ORDER — HYDRALAZINE HCL 10 MG PO TABS: 10.0000 mg | ORAL_TABLET | Freq: Three times a day (TID) | ORAL | 1 refills | Status: DC

## 2022-08-31 NOTE — Telephone Encounter (Signed)
Patient's daughter said that blood pressure meds were changed and the Blood pressure was in the 170's/high 80s. This morning it was 196/98. Daughter, Madeline Daniel, said no other symptoms different from baseline when asked if patient has headaches/dizziness. She said the blood pressure has varied but the highest has been last night and today. She would like instructions on whether she needs to give her mom something else in addition to blood pressure medication or if she needs to bring her in. Please call Madeline Daniel to advise.

## 2022-08-31 NOTE — Telephone Encounter (Signed)
See my chart message

## 2022-09-01 ENCOUNTER — Encounter: Payer: Self-pay | Admitting: Internal Medicine

## 2022-09-01 ENCOUNTER — Other Ambulatory Visit (INDEPENDENT_AMBULATORY_CARE_PROVIDER_SITE_OTHER): Payer: Medicare Other

## 2022-09-01 ENCOUNTER — Emergency Department (HOSPITAL_BASED_OUTPATIENT_CLINIC_OR_DEPARTMENT_OTHER)
Admission: EM | Admit: 2022-09-01 | Discharge: 2022-09-01 | Disposition: A | Discharge status: Home / Self Care | Payer: Medicare Other | Attending: Emergency Medicine | Admitting: Emergency Medicine

## 2022-09-01 ENCOUNTER — Ambulatory Visit (INDEPENDENT_AMBULATORY_CARE_PROVIDER_SITE_OTHER): Payer: Medicare Other | Admitting: Internal Medicine

## 2022-09-01 ENCOUNTER — Encounter (HOSPITAL_BASED_OUTPATIENT_CLINIC_OR_DEPARTMENT_OTHER): Payer: Self-pay | Admitting: Emergency Medicine

## 2022-09-01 ENCOUNTER — Telehealth: Payer: Self-pay | Admitting: Internal Medicine

## 2022-09-01 DIAGNOSIS — R42 Dizziness and giddiness: Secondary | ICD-10-CM

## 2022-09-01 DIAGNOSIS — E538 Deficiency of other specified B group vitamins: Secondary | ICD-10-CM

## 2022-09-01 DIAGNOSIS — E871 Hypo-osmolality and hyponatremia: Secondary | ICD-10-CM | POA: Diagnosis not present

## 2022-09-01 DIAGNOSIS — R251 Tremor, unspecified: Secondary | ICD-10-CM | POA: Diagnosis not present

## 2022-09-01 LAB — CBC
HCT: 38.6 % (ref 36.0–46.0)
Hemoglobin: 13.4 g/dL (ref 12.0–15.0)
MCH: 34.1 pg — ABNORMAL HIGH (ref 26.0–34.0)
MCHC: 34.7 g/dL (ref 30.0–36.0)
MCV: 98.2 fL (ref 80.0–100.0)
Platelets: 266 10*3/uL (ref 150–400)
RBC: 3.93 MIL/uL (ref 3.87–5.11)
RDW: 12.2 % (ref 11.5–15.5)
WBC: 7.8 10*3/uL (ref 4.0–10.5)
nRBC: 0 % (ref 0.0–0.2)

## 2022-09-01 LAB — BASIC METABOLIC PANEL
Anion gap: 9 (ref 5–15)
BUN: 14 mg/dL (ref 8–23)
CO2: 26 mmol/L (ref 22–32)
Calcium: 9 mg/dL (ref 8.9–10.3)
Chloride: 95 mmol/L — ABNORMAL LOW (ref 98–111)
Creatinine, Ser: 0.93 mg/dL (ref 0.44–1.00)
GFR, Estimated: >60 mL/min (ref >60)
Glucose, Bld: 111 mg/dL — ABNORMAL HIGH (ref 70–99)
Potassium: 4.3 mmol/L (ref 3.5–5.1)
Sodium: 130 mmol/L — ABNORMAL LOW (ref 135–145)

## 2022-09-01 MED ORDER — CYANOCOBALAMIN 1000 MCG/ML IJ SOLN
1000.0000 ug | Freq: Once | INTRAMUSCULAR | Status: AC
  Administered 2022-09-01: 1000 ug via INTRAMUSCULAR

## 2022-09-01 NOTE — Progress Notes (Cosign Needed Addendum)
Received message from Stoy in lab. Pt started to feel faint and wanted someone to check on her.   Amber checked initial vital signs:   BP: 165/94 HR: 70s O2: 99%  Instructed to wait 10 mins and recheck vitals.   I asked Pt if she started to feel back after blood draw or B12 injection. She stated mainly after blood draw but had felt unwell all day.   After 10 minutes-   BP: 144/86 HR: 74 O2: 96%

## 2022-09-01 NOTE — ED Triage Notes (Signed)
Family reports pt was upstairs having blood drawn. After blood draw, pt felt lightheaded and nauseous. Sx have improved since they started. BP was taken and it was 751W systolic per pt.

## 2022-09-01 NOTE — Progress Notes (Signed)
[name] is a 81 y.o. female presents to the office today for 2/4 weekly B12 injections, per physician's orders. Original order: 08/17/22: She will be getting once a week for 4 weekls and then monthly injections, per physician's orders. cyanocobalamin  (dose),  1000 mg/ml (route) was administered In L deltoid (location) today. Patient tolerated injection. Patient due for follow up labs/provider appt: No.  Patient next injection due: 1 week , appt made Yes  Creft, Kristine Garbe L

## 2022-09-01 NOTE — Telephone Encounter (Signed)
Patient came to the office to get a B12 shot and blood drawn. Shortly after the blood drawn she reported feeling unwell, near fainting, her head felt heavy. She is not on any visible distress, no diaphoresis, no dyspnea, sitting on the wheelchair. Denies chest pain or difficulty breathing. Initial set of vital signs show a normal O2 sat at 99%, BP 165/94 and heart rate of 70. After approximately 25 minutes, she still felt poorly, very weak. Suspected vasovagal event but she is not getting better, recommend ER evaluation, her daughter is with her, she is a retired [employer] scientist, she agrees. We escorted her to the ER, I spoke with the ER physician, appreciate her help.

## 2022-09-01 NOTE — ED Provider Notes (Signed)
[facility] Provider Note   CSN: [id number] Arrival date & time: 09/01/22  1514     History  Chief Complaint  Patient presents with   Dizziness    Madeline Daniel is a 81 y.o. female.  81 yo F with a chief complaints of not feeling well.  The patient was at her doctor's office and had a B12 injection and had blood drawn.  She started to feel bad and notified her physician.  This lasted much longer than expected and she was sent down to the ED for evaluation.  She said it lasted a few hours and actually has completely resolved about an hour ago.  She felt like there was a weight on her head and feel like her vision was getting a bit dark.  She denies any chest pain or difficulty breathing denies abdominal pain.  Denied headache or neck pain.   Dizziness      Home Medications Prior to Admission medications   Medication Sig Start Date End Date Taking? Authorizing Provider  albuterol (VENTOLIN HFA) 108 (90 Base) MCG/ACT inhaler Inhale 1-2 puffs into the lungs every 6 (six) hours as needed for wheezing or shortness of breath. 04/29/22   Colon Branch, MD  alendronate (FOSAMAX) 70 MG tablet Take 1 tablet (70 mg total) by mouth once a week. Take with a full glass of water on an empty stomach. Remain sitting upright for 45-60 mins 02/19/22   Colon Branch, MD  ALPRAZolam Duanne Moron) 0.5 MG tablet Take 0.5-1 tablets (0.25-0.5 mg total) by mouth daily as needed for anxiety. 05/11/22   Colon Branch, MD  amoxicillin (AMOXIL) 875 MG tablet Take 1 tablet (875 mg total) by mouth 2 (two) times daily. 08/21/22   Colon Branch, MD  atorvastatin (LIPITOR) 40 MG tablet Take 40 mg by mouth daily.    [provider]  Cholecalciferol (VITAMIN D3) 50 MCG (2000 UT) capsule Take 1 capsule (2,000 Units total) by mouth daily. 02/19/22   Colon Branch, MD  DULoxetine (CYMBALTA) 30 MG capsule TAKE 1 CAPSULE BY MOUTH DAILY  WITH 60 MG CAPSULE FOR A TOTAL  DAILY DOSE OF 90 MG Patient not  taking: Reported on 08/17/2022 08/06/22   Colon Branch, MD  DULoxetine (CYMBALTA) 60 MG capsule TAKE 1 CAPSULE BY MOUTH DAILY  TAKE WITH 30 MG CAPSULE TO EQUAL 90 MG DAILY 08/06/22   Colon Branch, MD  folic acid (FOLVITE) 0.5 MG tablet Take 1 mg by mouth daily.    [provider]  hydrALAZINE (APRESOLINE) 10 MG tablet Take 1 tablet (10 mg total) by mouth 3 (three) times daily. 08/31/22   Colon Branch, MD  lisinopril (ZESTRIL) 40 MG tablet Take 40 mg by mouth daily.    [provider]  meclizine (ANTIVERT) 25 MG tablet Take 1 tablet (25 mg total) by mouth 3 (three) times daily as needed for dizziness. 05/19/22   Terrilyn Saver, NP  metoprolol succinate (TOPROL-XL) 50 MG 24 hr tablet Take 1 tablet (50 mg total) by mouth daily. Take with or immediately following a meal. 08/18/22   Colon Branch, MD  naloxone [facility]) nasal spray 4 mg/0.1 mL Place 1 spray into the nose. As directed Patient not taking: Reported on 02/10/2022 11/06/21   Colon Branch, MD  omeprazole (PRILOSEC) 20 MG capsule Take 20 mg by mouth 2 (two) times daily before a meal.    [provider]  ondansetron (ZOFRAN) 4  MG tablet Take 1 tablet (4 mg total) by mouth every 8 (eight) hours as needed for nausea or vomiting. Patient not taking: Reported on 08/17/2022 03/24/22   Colon Branch, MD  primidone (MYSOLINE) 50 MG tablet Take 100 mg by mouth at bedtime.    [provider]  propranolol (INDERAL) 20 MG tablet Take 1 tablet (20 mg total) by mouth 3 (three) times daily as needed. 05/04/22   Marcial Pacas, MD      Allergies    Patient has no known allergies.    Review of Systems   Review of Systems  Neurological:  Positive for dizziness.    Physical Exam Updated Vital Signs BP (!) 164/71   Pulse 75   Temp 98.2 F (36.8 C) (Oral)   Resp 20   Ht 4\' 11"  (1.499 m)   Wt 96.2 kg   SpO2 100%   BMI 42.82 kg/m  Physical Exam Vitals and nursing note reviewed.  Constitutional:      General: She is not in acute  distress.    Appearance: She is well-developed. She is not diaphoretic.  HENT:     Head: Normocephalic and atraumatic.  Eyes:     Pupils: Pupils are equal, round, and reactive to light.  Cardiovascular:     Rate and Rhythm: Normal rate and regular rhythm.     Heart sounds: No murmur heard.    No friction rub. No gallop.  Pulmonary:     Effort: Pulmonary effort is normal.     Breath sounds: No wheezing or rales.  Abdominal:     General: There is no distension.     Palpations: Abdomen is soft.     Tenderness: There is no abdominal tenderness.  Musculoskeletal:        General: No tenderness.     Cervical back: Normal range of motion and neck supple.  Skin:    General: Skin is warm and dry.  Neurological:     Mental Status: She is alert and oriented to person, place, and time.     Cranial Nerves: Cranial nerves 2-12 are intact.     Sensory: Sensation is intact.     Motor: Motor function is intact.     Coordination: Coordination is intact.     Comments: Tremor and bilateral upper extremities otherwise benign neurologic exam  Psychiatric:        Behavior: Behavior normal.     ED Results / Procedures / Treatments   Labs (all labs ordered are listed, but only abnormal results are displayed) Labs Reviewed  CBC - Abnormal; Notable for the following components:      Result Value   MCH 34.1 (*)    All other components within normal limits  BASIC METABOLIC PANEL - Abnormal; Notable for the following components:   Sodium 130 (*)    Chloride 95 (*)    Glucose, Bld 111 (*)    All other components within normal limits    EKG EKG Interpretation  Date/Time:  Tuesday September 01 2022 15:29:16 EST Ventricular Rate:  71 PR Interval:  160 QRS Duration: 66 QT Interval:  366 QTC Calculation: 397 R Axis:   77 Text Interpretation: Normal sinus rhythm Cannot rule out Anterior infarct , age undetermined Abnormal ECG No old tracing to compare Confirmed by Deno Etienne [id number]) on 09/01/2022  5:50:28 PM  Radiology No results found.  Procedures Procedures    Medications Ordered in ED Medications - No data to display  ED Course/ Medical  Decision Making/ A&P                           Medical Decision Making Amount and/or Complexity of Data Reviewed Labs: ordered.   81 yo F with a chief complaints of not feeling well after having her labs drawn today and getting a B12 injection.  She was seen and sent down by her family doctor for evaluation.  Family states that they came to see their doctor to have their sodium rechecked and that the primary concern.  Sodium level is actually up from what it was previously.  Patient's symptoms had resolved about an hour ago.  EKG is nonischemic.  No anemia.  Benign neurologic exam.  Will discharge home.  Have her follow-up with her doctor in the office.  7:16 PM:  I have discussed the diagnosis/risks/treatment options with the patient.  Evaluation and diagnostic testing in the emergency department does not suggest an emergent condition requiring admission or immediate intervention beyond what has been performed at this time.  They will follow up with PCP. We also discussed returning to the ED immediately if new or worsening sx occur. We discussed the sx which are most concerning (e.g., sudden worsening pain, fever, inability to tolerate by mouth) that necessitate immediate return. Medications administered to the patient during their visit and any new prescriptions provided to the patient are listed below.  Medications given during this visit Medications - No data to display   The patient appears reasonably screen and/or stabilized for discharge and I doubt any other medical condition or other [facility] requiring further screening, evaluation, or treatment in the ED at this time prior to discharge.          Final Clinical Impression(s) / ED Diagnoses Final diagnoses:  Lightheadedness    Rx / DC Orders ED Discharge Orders     None          Deno Etienne, DO 09/01/22 1916

## 2022-09-01 NOTE — Discharge Instructions (Signed)
Eat and drink as well as you can for the next 48 hours.  Please follow-up with your family doctor in the office.  Please return for recurrent or worsening symptoms especially if you develop headache one-sided numbness or weakness chest pain shortness of breath abdominal pain.

## 2022-09-02 ENCOUNTER — Ambulatory Visit (INDEPENDENT_AMBULATORY_CARE_PROVIDER_SITE_OTHER): Payer: Medicare Other | Admitting: Pharmacist

## 2022-09-02 DIAGNOSIS — F419 Anxiety disorder, unspecified: Secondary | ICD-10-CM

## 2022-09-02 DIAGNOSIS — F32A Depression, unspecified: Secondary | ICD-10-CM

## 2022-09-02 DIAGNOSIS — I10 Essential (primary) hypertension: Secondary | ICD-10-CM

## 2022-09-02 LAB — BASIC METABOLIC PANEL WITH GFR
BUN: 14 mg/dL (ref 6–23)
CO2: 25 meq/L (ref 19–32)
Calcium: 9.4 mg/dL (ref 8.4–10.5)
Chloride: 95 meq/L — ABNORMAL LOW (ref 96–112)
Creatinine, Ser: 0.86 mg/dL (ref 0.40–1.20)
GFR: 62.45 mL/min (ref >60.00)
Glucose, Bld: 105 mg/dL — ABNORMAL HIGH (ref 70–99)
Potassium: 4.5 meq/L (ref 3.5–5.1)
Sodium: 129 meq/L — ABNORMAL LOW (ref 135–145)

## 2022-09-02 NOTE — Progress Notes (Signed)
Pharmacy Note  09/02/2022 Name: ANGELISSE COLLIER MRN: [id number] DOB: 12/20/1940  Subjective: Madeline Daniel is a 81 y.o. year old female who is a primary care patient of Colon Branch, MD. Clinical Pharmacist Practitioner referral was placed to assist with medication management.    Engaged with patient by telephone for follow up visit today.  Hypertension - hydrochlorothiazide was held after our last visit due to hyponatremia but patient restarted due to increase in LEE. Dr Larose Kells stopped both amlodipine and hydrochlorothiazide 08/18/2022 and increased metoprolol from 25 to 50mg  daily. Patient is also taking lisinopril 40mg  daily. Blood pressure was still elevated to hydralazine 10mg  3 times daily was added 08/31/2022. Patient has only had 4 doses of hydralazine so far and has not checked blood pressure at home since starting hydralazine.  Hyponatremia - sodium was 130 09/01/2022 - improved form 125 since hydrochlorothiazide was stopped. Tried lowering dose of duloxetine to see if that helped by but patient needed to restart 90mg  duloxetine daily due to anxiety.  Anxiety / Depression / Insomnia - taking duloxetine 90mg  daily and alprazolam 0.5mg  - 0.5 to 1 tablet as needed. Patient would like higher dose of alprazolam. She takes 0.5 tablet twice a day most days (afternoon and at bedtime) but still had anxiety. She is to see a geriatric psychiatrist 09/2022.  Medication management- reviewed med list and refill history. All maintenance meds have been filled on time except alendronate. Last refill date for alendronate 01/28/2022 for 84 day supply. Per patient and her daughter she has 3 tablets of alendronate left.  Patient uses weekly pill reminder that daughter assists in filling. Patient denies and issues with swallowing medications.   Objective: Review of patient status, including review of consultants reports, laboratory and other test data, was performed as part of comprehensive evaluation and  provision of chronic care management services.   Lab Results  Component Value Date   CREATININE 0.93 09/01/2022   CREATININE 0.86 09/01/2022   CREATININE 1.06 08/17/2022    Lab Results  Component Value Date   HGBA1C 6.0 08/17/2022       Component Value Date/Time   CHOL 178 03/24/2022 1553   TRIG 138.0 03/24/2022 1553   HDL 77.50 03/24/2022 1553   CHOLHDL 2 03/24/2022 1553   VLDL 27.6 03/24/2022 1553   LDLCALC 73 03/24/2022 1553     Clinical ASCVD: Yes  The ASCVD Risk score (Arnett DK, et al., 2019) failed to calculate for the following reasons:   The 2019 ASCVD risk score is only valid for ages 15 to 54    BP Readings from Last 3 Encounters:  09/01/22 (!) 164/71  08/17/22 (!) 142/84  05/19/22 123/68     No Known Allergies  Medications Reviewed Today     Reviewed by Cherre Robins, RPH-CPP (Pharmacist) on 09/02/22 at 1451  Med List Status: <None>   Medication Order Taking? Sig Documenting Provider Last Dose Status Informant  albuterol (VENTOLIN HFA) 108 (90 Base) MCG/ACT inhaler [id number] Yes Inhale 1-2 puffs into the lungs every 6 (six) hours as needed for wheezing or shortness of breath. Colon Branch, MD Taking Active   alendronate (FOSAMAX) 70 MG tablet [id number] Yes Take 1 tablet (70 mg total) by mouth once a week. Take with a full glass of water on an empty stomach. Remain sitting upright for 45-60 mins Colon Branch, MD Taking Active   ALPRAZolam Duanne Moron) 0.5 MG tablet [id number] Yes Take 0.5-1 tablets (0.25-0.5 mg total) by mouth daily  as needed for anxiety. Colon Branch, MD Taking Active   atorvastatin (LIPITOR) 40 MG tablet [id number] Yes Take 40 mg by mouth daily. [provider] Taking Active   Cholecalciferol (VITAMIN D3) 50 MCG (2000 UT) capsule [id number] Yes Take 1 capsule (2,000 Units total) by mouth daily. Colon Branch, MD Taking Active   DULoxetine (CYMBALTA) 30 MG capsule [id number] Yes TAKE 1 CAPSULE BY MOUTH DAILY  WITH 60 MG CAPSULE FOR A TOTAL   DAILY DOSE OF 90 MG Colon Branch, MD Taking Active            Med Note ([place], [place] Sep 02, 2022  2:21 PM)    DULoxetine (CYMBALTA) 60 MG capsule [id number] Yes TAKE 1 CAPSULE BY MOUTH DAILY  TAKE WITH 30 MG CAPSULE TO EQUAL 38 MG DAILY Colon Branch, MD Taking Active            Med Note [place], [place]   Wed Nov 8, [id number]  0000000 PM)    folic acid (FOLVITE) 0.5 MG tablet [id number] Yes Take 1 mg by mouth daily. (1mg  = 1067mcg) [provider] Taking Active   hydrALAZINE (APRESOLINE) 10 MG tablet [id number] Yes Take 1 tablet (10 mg total) by mouth 3 (three) times daily. Colon Branch, MD Taking Active   lisinopril (ZESTRIL) 40 MG tablet [id number] Yes Take 40 mg by mouth daily. [provider] Taking Active   meclizine (ANTIVERT) 25 MG tablet [id number] Yes Take 1 tablet (25 mg total) by mouth 3 (three) times daily as needed for dizziness. Terrilyn Saver, NP Taking Active            Med Note Alinda Money, [place] Aug 17, 2022 12:39 PM) PRN  metoprolol succinate (TOPROL-XL) 50 MG 24 hr tablet [id number] Yes Take 1 tablet (50 mg total) by mouth daily. Take with or immediately following a meal. Colon Branch, MD Taking Active            Med Note [place], [place] B   Wed Sep 02, 2022  2:32 PM) Takes at 9pm  naloxone [facility]) nasal spray 4 mg/0.1 mL [id number] No Place 1 spray into the nose. As directed  Patient not taking: Reported on 02/10/2022   Colon Branch, MD Not Taking Active   omeprazole (PRILOSEC) 20 MG capsule [id number] Yes Take 20 mg by mouth 2 (two) times daily before a meal. [provider] Taking Active   ondansetron (ZOFRAN) 4 MG tablet [id number] No Take 1 tablet (4 mg total) by mouth every 8 (eight) hours as needed for nausea or vomiting.  Patient not taking: Reported on 08/17/2022   Colon Branch, MD Not Taking Active   primidone (MYSOLINE) 50 MG tablet [id number] Yes Take 100 mg by mouth at bedtime. [provider] Taking Active   propranolol (INDERAL) 20 MG  tablet [id number] Yes Take 1 tablet (20 mg total) by mouth 3 (three) times daily as needed. Marcial Pacas, MD Taking Active             Patient Active Problem List   Diagnosis Date Noted   Gait abnormality 05/04/2022   Sensorineural hearing loss (SNHL) of right ear 05/04/2022   Diplopia 05/04/2022   Annual physical exam 03/24/2022   Hypertension, essential 08/07/2021   Hyperlipidemia 08/07/2021   Anxiety and depression 08/07/2021   Chronic nonalcoholic liver disease [id number]   GERD (gastroesophageal reflux disease) 08/07/2021   Meniere's disease 08/07/2021  CKD (chronic kidney disease), stage III (HCC) 08/07/2021   Severe depression (HCC) 08/07/2021   Atherosclerosis of aorta (HCC) 08/07/2021   Chronic pain syndrome 08/07/2021   Barrett's esophagus 08/07/2021   Peptic ulcer disease 08/07/2021   Primary osteoarthritis involving multiple joints 07/02/2021   PCP NOTES >>>>>>>>>>>>>>> 07/01/2021   Vertigo 10/17/2019   Essential tremor 10/17/2019   Abnormal gait 10/17/2019   Abnormal MRI of head 10/17/2019   Ocular hypertension, 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   Dry eye syndrome, bilateral 01/07/2018   PVD (posterior vitreous detachment) 11/18/2015   PCO (posterior capsular opacification) 11/18/2015   Degenerative retinal drusen of both eyes 11/18/2015   Biliary dyskinesia 04/04/2015     Medication Assistance:  None required.  Patient affirms current coverage meets needs.   Assessment / Plan: Hypertension - not at goal Continue current regimen for now.  Restart checking blood pressure at home - call office if blood pressure continues to be > 140/90 with addition of hydralazine.  Hyponatremia - sodium was 130 on 09/01/2022 - improving  Continue to hold HCTZ.  Anxiety / Depression / Insomnia - uncontrolled Continue duloxetine 90mg  daily and alprazolam 0.5mg  - 0. Daughter to look into possible trial of buspirone for anxiety and discuss with geriatric psychiatrist at appointment in  December 2023.  Medication management-  reviewed med list and refill history. Discussed adherence. Continue to use weekly med boxes / reminders.  Health Maintenance:  Discussed annual flu vaccine - will get next week when in office for B12 injection  Discussed last labs.  Discussed getting Shingrix vaccine - will plan to get in the next few months.   Follow Up:  No further follow up required: hypertension is managed by PCP. Patient and her daughter are aware they can contact me if she have any medication related questions or needs.    January 2024, PharmD Clinical Pharmacist [facility]  - [facility] [phone]

## 2022-09-04 ENCOUNTER — Telehealth: Payer: Self-pay | Admitting: Internal Medicine

## 2022-09-04 NOTE — Telephone Encounter (Signed)
Okay, thank you!

## 2022-09-04 NOTE — Addendum Note (Signed)
Addended byConrad Davenport D on: 09/04/2022 04:28 PM   Modules accepted: Orders

## 2022-09-04 NOTE — Telephone Encounter (Signed)
[name] (daughter) called to advise Dr. Drue Novel of the following:  Pt is feeling better. Her BP is at 154/74 and she is taking the new medication.

## 2022-09-04 NOTE — Telephone Encounter (Signed)
Requesting: alprazolam 0.5mg  Contract: 11/03/21 UDS:08/17/22 Last Visit: 09/01/22 Next Visit: 10/13/22 Last Refill: 05/11/22 #30 and 3RF   Please Advise

## 2022-09-04 NOTE — Telephone Encounter (Signed)
Zadie Cleverly, patient's daughter, [facility], asked for a call back.

## 2022-09-07 MED ORDER — ALPRAZOLAM 0.5 MG PO TABS: 0.2500 mg | ORAL_TABLET | Freq: Every day | ORAL | 3 refills | Status: DC | PRN

## 2022-09-07 NOTE — Addendum Note (Signed)
Addended by: Willow Ora E on: 09/07/2022 04:21 PM   Modules accepted: Orders

## 2022-09-07 NOTE — Telephone Encounter (Signed)
PDMP review, was dispensed 30-day supply 08/08/2022.  Rx sent.

## 2022-09-08 ENCOUNTER — Ambulatory Visit (INDEPENDENT_AMBULATORY_CARE_PROVIDER_SITE_OTHER): Payer: Medicare Other | Admitting: *Deleted

## 2022-09-08 DIAGNOSIS — E538 Deficiency of other specified B group vitamins: Secondary | ICD-10-CM | POA: Diagnosis not present

## 2022-09-08 DIAGNOSIS — Z23 Encounter for immunization: Secondary | ICD-10-CM | POA: Diagnosis not present

## 2022-09-08 MED ORDER — CYANOCOBALAMIN 1000 MCG/ML IJ SOLN
1000.0000 ug | Freq: Once | INTRAMUSCULAR | Status: AC
  Administered 2022-09-08: 1000 ug via INTRAMUSCULAR

## 2022-09-08 NOTE — Progress Notes (Signed)
Patient here for flu vaccine and weekly b12 injection 3/4 per Dr. Drue Novel.  Flu vaccine given in right deltoid and b12 injection given in left deltoid and patient tolerated well  Next appointment scheduled for 09/15/22 for last weekly b12 injection.Marland Kitchen

## 2022-09-12 ENCOUNTER — Encounter: Payer: Self-pay | Admitting: Internal Medicine

## 2022-09-14 ENCOUNTER — Encounter: Payer: Self-pay | Admitting: Internal Medicine

## 2022-09-15 ENCOUNTER — Encounter: Payer: Self-pay | Admitting: Internal Medicine

## 2022-09-15 ENCOUNTER — Ambulatory Visit (INDEPENDENT_AMBULATORY_CARE_PROVIDER_SITE_OTHER): Payer: Medicare Other | Admitting: *Deleted

## 2022-09-15 DIAGNOSIS — E538 Deficiency of other specified B group vitamins: Secondary | ICD-10-CM

## 2022-09-15 MED ORDER — CYANOCOBALAMIN 1000 MCG/ML IJ SOLN: 1000.0000 ug | INTRAMUSCULAR | Status: DC

## 2022-09-15 MED ORDER — CYANOCOBALAMIN 1000 MCG/ML IJ SOLN
1000.0000 ug | Freq: Once | INTRAMUSCULAR | Status: AC
  Administered 2022-09-15: 1000 ug via INTRAMUSCULAR

## 2022-09-15 NOTE — Progress Notes (Signed)
Patient here for flu vaccine and weekly b12 injection 4/4 per Dr. Drue Novel.   Flu vaccine given in right deltoid and b12 injection given in left deltoid and patient tolerated well.  Patient has an appointment with Dr. Drue Novel on 10/13/22 and she will get the monthly injection at that time.

## 2022-09-16 MED ORDER — HYDRALAZINE HCL 10 MG PO TABS: 20.0000 mg | ORAL_TABLET | Freq: Three times a day (TID) | ORAL | Status: DC

## 2022-09-16 NOTE — Telephone Encounter (Signed)
Spoke with [name]. BPs are elevated, heart rate in the 70s.  Currently on: Hydralazine 10 mg 3 times daily Lisinopril 40 mg daily Metoprolol 50 mg daily (Inderal only as needed for tremors). Plan: Increase hydralazine to 20 mg 3 times daily, monitor BP, [name] will call for a office visit next week

## 2022-09-20 ENCOUNTER — Other Ambulatory Visit: Payer: Self-pay | Admitting: Internal Medicine

## 2022-09-21 ENCOUNTER — Encounter: Payer: Self-pay | Admitting: Internal Medicine

## 2022-09-21 ENCOUNTER — Ambulatory Visit (INDEPENDENT_AMBULATORY_CARE_PROVIDER_SITE_OTHER): Payer: Medicare Other | Admitting: Internal Medicine

## 2022-09-21 VITALS — BP 146/84 | HR 82 | Temp 97.3°F | Resp 18 | Ht 59.0 in | Wt 210.5 lb

## 2022-09-21 DIAGNOSIS — F419 Anxiety disorder, unspecified: Secondary | ICD-10-CM

## 2022-09-21 DIAGNOSIS — I10 Essential (primary) hypertension: Secondary | ICD-10-CM | POA: Diagnosis not present

## 2022-09-21 DIAGNOSIS — E871 Hypo-osmolality and hyponatremia: Secondary | ICD-10-CM

## 2022-09-21 DIAGNOSIS — F32A Depression, unspecified: Secondary | ICD-10-CM | POA: Diagnosis not present

## 2022-09-21 DIAGNOSIS — E538 Deficiency of other specified B group vitamins: Secondary | ICD-10-CM

## 2022-09-21 MED ORDER — METOPROLOL SUCCINATE ER 50 MG PO TB24: 100.0000 mg | ORAL_TABLET | Freq: Every day | ORAL | 0 refills | Status: DC

## 2022-09-21 MED ORDER — HYDRALAZINE HCL 25 MG PO TABS: 25.0000 mg | ORAL_TABLET | Freq: Three times a day (TID) | ORAL | 3 refills | Status: DC

## 2022-09-21 MED ORDER — ONDANSETRON HCL 4 MG PO TABS: 4.0000 mg | ORAL_TABLET | Freq: Three times a day (TID) | ORAL | 0 refills | Status: AC | PRN

## 2022-09-21 NOTE — Patient Instructions (Addendum)
For hypertension: Increase hydralazine to 25 mg 1 tablet 3 times a day Metoprolol XL 50: Take 2 tablets daily. Continue lisinopril  Check the  blood pressure regularly BP GOAL is between 110/65 and  135/85. Also your heart rate should be no lower than 55.  Call me in 2 weeks, let me know how things are going.  Sooner if needed.   For allergies: Astepro nasal spray 2 sprays on each side of the nose daily Flonase: 2 sprays on each side of the nose daily Allegra 60 mg 1 tablet daily.

## 2022-09-21 NOTE — Progress Notes (Unsigned)
Subjective:    Patient ID: Madeline Daniel, female    DOB: 09-Aug-1941, 81 y.o.   MRN: [id number]  DOS:  09/21/2022 Type of visit - description: Follow-up  Here with her daughter for BP management  Good compliance with medications as prescribed BP is still elevated in the ambulatory setting.  Also, she reports that continue with having some pressure at the left face mostly. She feels that her left maxillary sinuses are full and she has pressure on the left ear. No fever chills She is unable to blow any discharge from the nose. She already had some antibiotics.   Review of Systems See above   Past Medical History:  Diagnosis Date   Anxiety    Arthritis    Atherosclerosis of aorta (HCC)    Barrett's esophagus    Bleeding ulcer 2005   CKD (chronic kidney disease), stage III (HCC)    Depression    Essential tremor    Gait disorder    GERD (gastroesophageal reflux disease)    History of rib fracture    multiple   History of stomach ulcers    Hyperlipidemia    Hypertension    Insomnia    Meniere disease    Nonalcoholic liver disease, chronic    Peptic ulcer disease    TIA (transient ischemic attack)    Vertigo     Past Surgical History:  Procedure Laterality Date   CARPAL TUNNEL RELEASE Right 02/06/2022   CATARACT EXTRACTION Bilateral    CHOLECYSTECTOMY     REPLACEMENT TOTAL KNEE BILATERAL Bilateral     Current Outpatient Medications  Medication Instructions   albuterol (VENTOLIN HFA) 108 (90 Base) MCG/ACT inhaler 1-2 puffs, Inhalation, Every 6 hours PRN   alendronate (FOSAMAX) 70 mg, Oral, Weekly, Take with a full glass of water on an empty stomach. Remain sitting upright for 45-60 mins   ALPRAZolam (XANAX) 0.25-0.5 mg, Oral, Daily PRN   atorvastatin (LIPITOR) 40 mg, Oral, Daily   DULoxetine (CYMBALTA) 30 MG capsule TAKE 1 CAPSULE BY MOUTH DAILY  WITH 60 MG CAPSULE FOR A TOTAL  DAILY DOSE OF 90 MG   DULoxetine (CYMBALTA) 60 MG capsule TAKE 1 CAPSULE BY MOUTH  DAILY  TAKE WITH 30 MG CAPSULE TO EQUAL 90 MG DAILY   folic acid (FOLVITE) 1 mg, Oral, Daily, (1mg  = )    hydrALAZINE (APRESOLINE) 20 mg, Oral, 3 times daily   lisinopril (ZESTRIL) 40 mg, Oral, Daily   meclizine (ANTIVERT) 25 mg, Oral, 3 times daily PRN   metoprolol succinate (TOPROL-XL) 50 mg, Oral, Daily, Take with or immediately following a meal.   naloxone (NARCAN) nasal spray 4 mg/0.1 mL 1 spray   omeprazole (PRILOSEC) 20 mg, Oral, 2 times daily before meals   ondansetron (ZOFRAN) 4 mg, Oral, Every 8 hours PRN   primidone (MYSOLINE) 100 mg, Oral, Daily at bedtime   propranolol (INDERAL) 20 mg, Oral, 3 times daily PRN   Vitamin D3 2,000 Units, Oral, Daily       Objective:   Physical Exam BP (!) 164/98   Pulse 82   Temp (!) 97.3 F (36.3 C) (Oral)   Resp 18   Ht 4\' 11"  (1.499 m)   Wt 210 lb 8 oz (95.5 kg)   SpO2 94%   BMI 42.52 kg/m  General:   Well developed, NAD, BMI noted. HEENT:  Normocephalic . Face symmetric, atraumatic Nose: Not congested TMs normal Sinuses slightly TTP throughout the maxillary & frontal sinuses. Lungs:  CTA  B Normal respiratory effort, no intercostal retractions, no accessory muscle use. Heart: RRR,  no murmur.  Lower extremities: no pretibial edema bilaterally  Skin: Not pale. Not jaundice Neurologic:  alert & oriented X3.  Speech normal, gait appropriate for age and unassisted Psych--  Cognition and judgment appear intact.  Cooperative with normal attention span and concentration.  Behavior appropriate. No anxious or depressed appearing.      Assessment     Assessment (new patient, referred by her daughter [name]) Hyperglycemia HTN High cholesterol Anxiety depression GI: -GERD, h/o esophageal dilatations , Barrett's  (see GI note 03/26/2020) -EGD:  04/02/2020: Normal duodenum, suggestion of Barrett's esophagus, hiatal hernia (dilatation), polyp stomach. Pathology not available -Question of liver cirrhosis, incidental  finding on CTA 11/05/2019. NEURO -Essential Tremor, onset age 30s  on primidone -Brain MRI-- volume loss and small vessel ischemic disease. -H./o TIA  -Menire, lack of balance, zofran prn -Gait d/o: due to dizziness >> DJD; uses a walker, h/o frequent fall Bronchospasm  DJD knee-hands-neck , tramadol prn Vitamin D deficiency Social: Move with her daughter [name] from [place], plans to live in [place] during the winters   PLAN: HTN: See message from few days ago, BP not well controlled, has been as high as 180/92. She is on lisinopril 40 mg daily, metoprolol 50 mg daily and I recommended to increase hydralazine to 20 mg 3 times daily BP at home continue to range from the 160s to 180s. Here, BP was 164/98, recheck: 146/84. Plan: Increase hydralazine to 25 mg 3 times daily Increase metoprolol XL 50 to to two bas qd. Monitor BPs. Hyponatremia: Due to hyponatremia, On 08/18/2022, I recommend to stop HCTZ and amlodipine (HCTZ was Rx to control swelling).  Subsequent sodium was 130.  Still in the low side.  On further questioning, she was told about hyponatremia few years ago.  Perhaps after all , this is a chronic issue even without diuretics. Recommend to reassess in 1 month.  BMP.  Also urinary sodium and serum osmolality?. B12 deficiency: Low levels on 08/17/2022, now on shots. Sinusitis: Allergic?  Continue Astepro, add Flonase. Follow-up as scheduled December 19.

## 2022-09-22 DIAGNOSIS — E538 Deficiency of other specified B group vitamins: Secondary | ICD-10-CM | POA: Insufficient documentation

## 2022-09-22 NOTE — Assessment & Plan Note (Signed)
HTN: See message from few days ago, BP not well controlled, has been as high as 180/92. She is on lisinopril 40 mg daily, metoprolol 50 mg daily and I recommended to increase hydralazine to 20 mg 3 times daily BP at home continue to range from the 160s to 180s. Here, BP was 164/98, recheck: 146/84. Plan: Increase hydralazine to 25 mg 3 times daily Increase metoprolol XL 50 to to two bas qd. Monitor BPs. Hyponatremia: Due to hyponatremia, On 08/18/2022, I recommend to stop HCTZ and amlodipine (HCTZ was Rx to control swelling).  Subsequent sodium was 130.  Still in the low side.  On further questioning, she was told about hyponatremia few years ago.  Perhaps after all , this is a chronic issue even without diuretics. Recommend to reassess in 1 month.  BMP.  Also urinary sodium and serum osmolality?. B12 deficiency: Low levels on 08/17/2022, now on shots. Sinusitis: Allergic?  Continue Astepro, add Flonase. Follow-up as scheduled December 19.

## 2022-09-23 ENCOUNTER — Telehealth: Payer: Self-pay | Admitting: *Deleted

## 2022-09-23 ENCOUNTER — Encounter: Payer: Self-pay | Admitting: Internal Medicine

## 2022-09-23 MED ORDER — HYDRALAZINE HCL 10 MG PO TABS: 25.0000 mg | ORAL_TABLET | Freq: Three times a day (TID) | ORAL | 0 refills | Status: DC

## 2022-09-23 NOTE — Addendum Note (Signed)
Addended byConrad Woodford D on: 09/23/2022 01:22 PM   Modules accepted: Orders

## 2022-09-23 NOTE — Progress Notes (Signed)
  Care Coordination   Note   09/23/2022 Name: Madeline Daniel MRN: [id number] DOB: 1940/11/13  Madeline Daniel is a 81 y.o. year old female who sees Drue Novel, Nolon Rod, MD for primary care. I reached out to Alferd Apa by phone today to offer care coordination services.  Madeline Daniel was given information about Care Coordination services today including:   The Care Coordination services include support from the care team which includes your Nurse Coordinator, Clinical Social Worker, or Pharmacist.  The Care Coordination team is here to help remove barriers to the health concerns and goals most important to you. Care Coordination services are voluntary, and the patient may decline or stop services at any time by request to their care team member.   Care Coordination Consent Status: Patient agreed to services and verbal consent obtained.   Follow up plan:  Telephone appointment with care coordination team member scheduled for:  10/07/2022  Encounter Outcome:  Pt. Scheduled from referral   Burman Nieves, [facility] Care Coordination Care Guide Direct Dial: [phone]

## 2022-09-23 NOTE — Telephone Encounter (Signed)
Sent a prescription for hydralazine 10 mg 2.5 tablets 3 times daily.

## 2022-09-23 NOTE — Progress Notes (Signed)
  Care Coordination  Outreach Note  09/23/2022 Name: KYESHA BALLA MRN: [id number] DOB: 1941/02/19   Care Coordination Outreach Attempts: An unsuccessful telephone outreach was attempted today to offer the patient information about available care coordination services as a benefit of their health plan.   Referral received   Follow Up Plan:  Additional outreach attempts will be made to offer the patient care coordination information and services.   Encounter Outcome:  No Answer  Burman Nieves, CCMA Care Coordination Care Guide Direct Dial: [phone]

## 2022-09-23 NOTE — Telephone Encounter (Signed)
Rx sent 

## 2022-09-28 ENCOUNTER — Encounter: Payer: Self-pay | Admitting: Internal Medicine

## 2022-09-29 ENCOUNTER — Other Ambulatory Visit: Payer: Self-pay | Admitting: Family Medicine

## 2022-09-30 ENCOUNTER — Other Ambulatory Visit: Payer: Self-pay | Admitting: Family

## 2022-09-30 MED ORDER — AMLODIPINE BESYLATE 5 MG PO TABS: 5.0000 mg | ORAL_TABLET | Freq: Every day | ORAL | 0 refills | Status: DC

## 2022-10-01 ENCOUNTER — Ambulatory Visit: Payer: Medicare Other | Admitting: Psychologist

## 2022-10-07 ENCOUNTER — Encounter: Payer: Medicare Other | Admitting: Licensed Clinical Social Worker

## 2022-10-07 ENCOUNTER — Ambulatory Visit: Payer: Self-pay

## 2022-10-07 NOTE — Patient Outreach (Signed)
  Care Coordination   Initial Visit Note   10/07/2022 Name: BRAYLON LEMMONS MRN: [id number] DOB: 01-09-1941  ERNA BROSSARD is a 81 y.o. year old female who sees Drue Novel, Nolon Rod, MD for primary care. I spoke with  Alferd Apa by phone today.  What matters to the patients health and wellness today?  RNCM called to patient. Daughter answered phone and provided patient's contact number. RNCM contacted patient. She reports she lives with daughter and her husband. She states she has been having issues with high blood pressure, but states medications was changed recently and she is seeing her blood pressure trend downward. She states her daugher is a retired Engineer, civil (consulting) and assist her as needed. She states blood pressure is checked daily and today blood pressure was 160/78. Upcoming appointment scheduled with PCP for 10/13/22 for follow up.  Goals Addressed             This Visit's Progress    Care Coordination Activities       Care Coordination Interventions: Discussed care coordination program Encouraged to attend take medications as prescribed Reviewed upcoming/scheduled appointments and encouraged to attend as scheduled. Confirmed patient has transportation.       SDOH assessments and interventions completed:  Yes  SDOH Interventions Today    Flowsheet Row Most Recent Value  SDOH Interventions   Food Insecurity Interventions Intervention Not Indicated  Housing Interventions Intervention Not Indicated  Transportation Interventions Intervention Not Indicated  Utilities Interventions Intervention Not Indicated     Care Coordination Interventions:  Yes, provided   Follow up plan: Follow up call scheduled for 10/27/22    Encounter Outcome:  Pt. Visit Completed   Kathyrn Sheriff, RN, MSN, BSN, CCM [facility] Care Coordinator [phone]

## 2022-10-07 NOTE — Patient Instructions (Signed)
Visit Information  Thank you for taking time to visit with me today. Please don't hesitate to contact me if I can be of assistance to you.   Following are the goals we discussed today:   Goals Addressed             This Visit's Progress    Care Coordination Activities       Care Coordination Interventions: Discussed care coordination program Encouraged to attend take medications as prescribed Reviewed upcoming/scheduled appointments and encouraged to attend as scheduled. Confirmed patient has transportation.       Our next appointment is by telephone on 10/27/22 at 2:00 pm  Please call the care guide team at [phone] if you need to cancel or reschedule your appointment.   If you are experiencing a Mental Health or Behavioral Health Crisis or need someone to talk to, please call the Suicide and Crisis Lifeline: 988  Patient verbalizes understanding of instructions and care plan provided today and agrees to view in MyChart. Active MyChart status and patient understanding of how to access instructions and care plan via MyChart confirmed with patient.     Kathyrn Sheriff, RN, MSN, BSN, CCM [facility] Care Coordinator [phone]

## 2022-10-08 ENCOUNTER — Encounter: Payer: Self-pay | Admitting: Internal Medicine

## 2022-10-08 ENCOUNTER — Ambulatory Visit: Payer: Self-pay | Admitting: Licensed Clinical Social Worker

## 2022-10-08 MED ORDER — METOPROLOL SUCCINATE ER 50 MG PO TB24: 150.0000 mg | ORAL_TABLET | Freq: Every day | ORAL | Status: DC

## 2022-10-08 MED ORDER — VALSARTAN 320 MG PO TABS: 320.0000 mg | ORAL_TABLET | Freq: Every day | ORAL | 1 refills | Status: DC

## 2022-10-08 NOTE — Patient Outreach (Signed)
  Care Coordination  Initial Visit Note   10/08/2022 Name: Madeline Daniel MRN: [id number] DOB: [birth year]  Madeline Daniel is a 81 y.o. year old female who sees Drue Novel, Nolon Rod, MD for primary care. I spoke with  Madeline Daniel 's daughter by phone today.  What matters to the patients health and wellness today?    Patient's daughter Madeline Daniel provided all information during this encounter. Reports no needs in caring for patient at this time, She is a retired Engineer, civil (consulting) and other her sister is an Estate manager/land agent. Recommendation: Patient may benefit from, and daughter is in agreement to review resources discussed and provided for long-term care planning for patient.     Goals Addressed             This Visit's Progress    COMPLETED: Caregiver Support No Follow up Required       Care Coordination Interventions: Caregiver stress acknowledged :  Discussed caregiver resources and support:   Discussed Special Assistance Medicaid  Provided educational information "When a loved one needs Long-term Care"           SDOH assessments and interventions completed:  No   Care Coordination Interventions:  Yes, provided   Follow up plan: No further intervention required by LCSW.  Daughter will reach out if needed.  Encounter Outcome:  Pt. Visit Completed   Sammuel Hines, LCSW Social Work Care Coordination  [facility] Emmie Niemann [employer] [phone]

## 2022-10-08 NOTE — Telephone Encounter (Signed)
Chart reviewed: -Off HCTZ due to hyponatremia  -Off hydralazine since 09/28/2022 because poor tolerance. -Currently on:  metoprolol XL 50 mg 2 tablets daily lisinopril 40 mg 1 tablet daily Amlodipine 5 mg  restarted 09/28/2022 (noting previous problems with swelling) Spoke with [name]: She is not sure if she has developed swelling in the last few days Heart rate in the 70s Plan: - Increase metoprolol to 3 tablets daily - Stop lisinopril, start valsartan 320 mg daily, prescription sent. -Continue amlodipine -Continue monitoring BPs and heart rate - Follow-up already scheduled for next week.

## 2022-10-08 NOTE — Patient Instructions (Signed)
Visit Information  Thank you for taking time to visit with me today. Please don't hesitate to contact me if I can be of assistance to you.   Following are the goals we discussed today:   Goals Addressed             This Visit's Progress    COMPLETED: Caregiver Support No Follow up Required       Care Coordination Interventions: Caregiver stress acknowledged :  Discussed caregiver resources and support:   Discussed Special Assistance Medicaid  Provided educational information "When a loved one needs Long-term Care"           Patient verbalizes understanding of instructions and care plan provided today and agrees to view in MyChart. Active MyChart status and patient understanding of how to access instructions and care plan via MyChart confirmed with patient.     No further follow up required: by [employer] Coordination provides support specific to your health needs that extend beyond exceptional routine office care you already receive from your primary care doctor.    If you are eligible for standard Care Coordination, there is no cost to you.  The Care Coordination team is made up of the following team members: Registered Nurse Care Guide: disease management, health education, care coordination and complex case management Clinical Social Work: Complex Care Coordination including coordination of level of care needs, mental and behavioral health assessment and recommendations, and connection to long-term mental health support Clinical Pharmacist: medication management, assistance and disease management Community Resource Care Guides: Armed forces operational officer Team: dedicated team of scheduling professionals to support patient and clinical team scheduling needs  Please call [phone] if you would like to schedule a phone appointment with one of the team members.    Sammuel Hines, LCSW Social Work Care Coordination  [facility] Emmie Niemann  [employer] [phone]

## 2022-10-13 ENCOUNTER — Ambulatory Visit (INDEPENDENT_AMBULATORY_CARE_PROVIDER_SITE_OTHER): Payer: Medicare Other | Admitting: Internal Medicine

## 2022-10-13 VITALS — BP 122/80 | HR 79 | Temp 97.6°F | Resp 18 | Ht 59.0 in | Wt 209.6 lb

## 2022-10-13 DIAGNOSIS — F419 Anxiety disorder, unspecified: Secondary | ICD-10-CM | POA: Diagnosis not present

## 2022-10-13 DIAGNOSIS — G4709 Other insomnia: Secondary | ICD-10-CM | POA: Diagnosis not present

## 2022-10-13 DIAGNOSIS — I10 Essential (primary) hypertension: Secondary | ICD-10-CM | POA: Diagnosis not present

## 2022-10-13 DIAGNOSIS — F32A Depression, unspecified: Secondary | ICD-10-CM | POA: Diagnosis not present

## 2022-10-13 DIAGNOSIS — E538 Deficiency of other specified B group vitamins: Secondary | ICD-10-CM | POA: Diagnosis not present

## 2022-10-13 MED ORDER — CYANOCOBALAMIN 1000 MCG/ML IJ SOLN
1000.0000 ug | Freq: Once | INTRAMUSCULAR | Status: AC
  Administered 2022-10-13: 1000 ug via INTRAMUSCULAR

## 2022-10-13 NOTE — Patient Instructions (Addendum)
Vaccines I recommend:  Covid booster Shingrix (shingles) RSV vaccine  Check the  blood pressure regularly BP GOAL is between 110/65 and  145/85. If it is consistently higher or lower, let me know   GO TO THE LAB : Get the blood work     GO TO THE FRONT DESK, PLEASE SCHEDULE YOUR APPOINTMENTS Come back for   a checkup in 3 months

## 2022-10-13 NOTE — Progress Notes (Unsigned)
Subjective:    Patient ID: Madeline Daniel, female    DOB: [birth year], 81 y.o.   MRN: [id number]  DOS:  10/13/2022 Type of visit - description: Follow-up  Since the last visit we communicated multiple times.  She will start care coordination with Madeline Daniel  Few days ago she called with elevated BP, medications adjusted. Since then, BPs are trending down.  Denies edema Reports she is feeling "the best I felt in years". although she still have some anxiety and difficulty sleeping  Review of Systems See above   Past Medical History:  Diagnosis Date   Anxiety    Arthritis    Atherosclerosis of aorta (HCC)    Barrett's esophagus    Bleeding ulcer 2005   CKD (chronic kidney disease), stage III (HCC)    Depression    Essential tremor    Gait disorder    GERD (gastroesophageal reflux disease)    History of rib fracture    multiple   History of stomach ulcers    Hyperlipidemia    Hypertension    Insomnia    Meniere disease    Nonalcoholic liver disease, chronic    Peptic ulcer disease    TIA (transient ischemic attack)    Vertigo     Past Surgical History:  Procedure Laterality Date   CARPAL TUNNEL RELEASE Right 02/06/2022   CATARACT EXTRACTION Bilateral    CHOLECYSTECTOMY     REPLACEMENT TOTAL KNEE BILATERAL Bilateral     Current Outpatient Medications  Medication Instructions   albuterol (VENTOLIN HFA) 108 (90 Base) MCG/ACT inhaler 1-2 puffs, Inhalation, Every 6 hours PRN   alendronate (FOSAMAX) 70 mg, Oral, Weekly, Take with a full glass of water on an empty stomach. Remain sitting upright for 45-60 mins   ALPRAZolam (XANAX) 0.25-0.5 mg, Oral, Daily PRN   amLODipine (NORVASC) 5 mg, Oral, Daily   atorvastatin (LIPITOR) 40 mg, Oral, Daily   DULoxetine (CYMBALTA) 30 MG capsule TAKE 1 CAPSULE BY MOUTH DAILY  WITH 60 MG CAPSULE FOR A TOTAL  DAILY DOSE OF 90 MG   DULoxetine (CYMBALTA) 60 MG capsule TAKE 1 CAPSULE BY MOUTH DAILY  TAKE WITH 30 MG CAPSULE TO EQUAL 90 MG  DAILY   fluticasone (FLONASE) 50 MCG/ACT nasal spray SPRAY 2 SPRAYS INTO EACH NOSTRIL EVERY DAY   folic acid (FOLVITE) 1 mg, Oral, Daily, (1mg  = )    meclizine (ANTIVERT) 25 mg, Oral, 3 times daily PRN   metoprolol succinate (TOPROL-XL) 150 mg, Oral, Daily, Take with or immediately following a meal.   naloxone (NARCAN) nasal spray 4 mg/0.1 mL 1 spray, Nasal, As directed   omeprazole (PRILOSEC) 20 mg, Oral, 2 times daily before meals   ondansetron (ZOFRAN) 4 mg, Oral, Every 8 hours PRN   primidone (MYSOLINE) 100 mg, Oral, Daily at bedtime   propranolol (INDERAL) 20 mg, Oral, 3 times daily PRN   valsartan (DIOVAN) 320 mg, Oral, Daily   Vitamin D3 2,000 Units, Oral, Daily       Objective:   Physical Exam BP 122/80 (BP Location: Left Arm, Patient Position: Sitting, Cuff Size: Large)   Pulse 79   Temp 97.6 F (36.4 C) (Temporal)   Resp 18   Ht 4\' 11"  (1.499 m)   Wt 209 lb 9.6 oz (95.1 kg)   SpO2 97%   BMI 42.33 kg/m  General:   Well developed, NAD, BMI noted. HEENT:  Normocephalic . Face symmetric, atraumatic Lungs:  CTA B Normal respiratory effort, no intercostal retractions,  no accessory muscle use. Heart: RRR,  no murmur.  Lower extremities: no pretibial edema bilaterally  Skin: Not pale. Not jaundice Neurologic:  alert & oriented X3.  Speech normal Psych--  Cognition and judgment appear intact.  Cooperative with normal attention span and concentration.  Behavior appropriate. No anxious or depressed appearing.      Assessment    Assessment (new patient, referred by her daughter Madeline Daniel) Hyperglycemia HTN  -Edema with amlodipine (tolerating well as of 09/2022) -Off HCTZ due to hyponatremia  -Off hydralazine since 09/28/2022 because poor tolerance. High cholesterol Anxiety depression insomnia GI: -GERD, h/o esophageal dilatations , Barrett's  (see GI note 03/26/2020) -EGD:  04/02/2020: Normal duodenum, suggestion of Barrett's esophagus, hiatal hernia  (dilatation), polyp stomach. Pathology not available -Question of liver cirrhosis, incidental finding on CTA 11/05/2019. NEURO -Essential Tremor, onset age 103s  on primidone -Brain MRI-- volume loss and small vessel ischemic disease. -H./o TIA  -Menire, lack of balance, zofran prn -Gait d/o: due to dizziness >> DJD; uses a walker, h/o frequent fall Bronchospasm  DJD knee-hands-neck , tramadol prn Vitamin D deficiency  Vitamin B12 deficiency (Dx 07-2022) Social: Move with her daughter Madeline Daniel from [place], plans to live in [place] during the winters   PLAN: HTN: Last week, BP was elevated, metoprolol increased to 3 tablets daily, lisinopril switched to valsartan and she continue with amlodipine. BPs are somewhat better: 132/70, 152/78.  Today BP is great. Plan: Continue present care, check BMP.  Low-salt diet, watch for edema. Anxiety depression insomnia: Currently on Cymbalta, Xanax 0.5 on average half tablet twice daily. Still get anxious at times and has some difficulty sleeping.  We had a long conversation about options, I really do not like to introduce a sleep aid in addition to Xanax.  Fortunately the patient's daughter set up an appointment with psychiatry, we agreed that she will discuss further steps with them. She is leaving for [place] in few weeks, to prevent problems, the daughter will call ahead of time to get to her 14-month supply of Xanax before she leaves town.  B12 deficiency: Shot today.  Now on B12 shots monthly. RTC 3 months       ===== HTN: See message from few days ago, BP not well controlled, has been as high as 180/92. She is on lisinopril 40 mg daily, metoprolol 50 mg daily and I recommended to increase hydralazine to 20 mg 3 times daily BP at home continue to range from the 160s to 180s. Here, BP was 164/98, recheck: 146/84. Plan: Increase hydralazine to 25 mg 3 times daily Increase metoprolol XL 50 to to two bas qd. Monitor BPs. Hyponatremia: Due to  hyponatremia, On 08/18/2022, I recommend to stop HCTZ and amlodipine (HCTZ was Rx to control swelling).  Subsequent sodium was 130.  Still in the low side.  On further questioning, she was told about hyponatremia few years ago.  Perhaps after all , this is a chronic issue even without diuretics. Recommend to reassess in 1 month.  BMP.  Also urinary sodium and serum osmolality?. B12 deficiency: Low levels on 08/17/2022, now on shots. Sinusitis: Allergic?  Continue Astepro, add Flonase. Follow-up as scheduled December 19.

## 2022-10-14 DIAGNOSIS — G4700 Insomnia, unspecified: Secondary | ICD-10-CM | POA: Insufficient documentation

## 2022-10-14 LAB — BASIC METABOLIC PANEL
BUN: 14 mg/dL (ref 6–23)
CO2: 27 mEq/L (ref 19–32)
Calcium: 9.3 mg/dL (ref 8.4–10.5)
Chloride: 96 mEq/L (ref 96–112)
Creatinine, Ser: 0.93 mg/dL (ref 0.40–1.20)
GFR: 56.8 mL/min — ABNORMAL LOW (ref >60.00)
Glucose, Bld: 112 mg/dL — ABNORMAL HIGH (ref 70–99)
Potassium: 4.7 mEq/L (ref 3.5–5.1)
Sodium: 133 mEq/L — ABNORMAL LOW (ref 135–145)

## 2022-10-14 NOTE — Assessment & Plan Note (Signed)
HTN: Last week, BP was elevated, metoprolol increased to 3 tablets daily, lisinopril switched to valsartan and she continue with amlodipine. BPs are somewhat better: 132/70, 152/78.  Today BP is great. Plan: Continue present care, check BMP.  Low-salt diet, watch for edema. Anxiety depression insomnia: Currently on Cymbalta, Xanax 0.5 on average half tablet twice daily. Still get anxious at times and has some difficulty sleeping.  We had a long conversation about options, I really do not like to introduce a sleep aid in addition to Xanax.  Fortunately the patient's daughter set up an appointment with psychiatry, we agreed that she will discuss further steps with them. She is leaving for [place] in few weeks, to prevent problems, the daughter will call ahead of time to get a 2-22-month supply of Xanax before she leaves town. B12 deficiency: Shot today.  Now on B12 shots monthly. RTC 3 months

## 2022-10-22 ENCOUNTER — Other Ambulatory Visit: Payer: Self-pay | Admitting: Internal Medicine

## 2022-10-27 ENCOUNTER — Telehealth: Payer: Self-pay

## 2022-10-27 ENCOUNTER — Encounter: Payer: Self-pay | Admitting: Internal Medicine

## 2022-10-27 NOTE — Patient Outreach (Signed)
  Care Coordination   10/27/2022 Name: Madeline Daniel MRN: [id number] DOB: 10/06/1941   Care Coordination Outreach Attempts:  An unsuccessful telephone outreach was attempted today to offer the patient information about available care coordination services as a benefit of their health plan.   Follow Up Plan:  Additional outreach attempts will be made to offer the patient care coordination information and services.   Encounter Outcome:  No Answer   Care Coordination Interventions:  No, not indicated    Thea Silversmith, RN, MSN, BSN, [place] Coordinator [phone]

## 2022-10-28 MED ORDER — VALSARTAN 320 MG PO TABS: 320.0000 mg | ORAL_TABLET | Freq: Every day | ORAL | 1 refills | Status: DC

## 2022-11-02 ENCOUNTER — Telehealth: Payer: Self-pay | Admitting: *Deleted

## 2022-11-02 NOTE — Progress Notes (Signed)
  Care Coordination Note  11/02/2022 Name: Madeline Daniel MRN: [id number] DOB: [birth year]  [name] is a 82 y.o. year old female who is a primary care patient of Colon Branch, MD and is actively engaged with the care management team. I reached out to [name] by phone today to assist with re-scheduling a follow up visit with the RN Case Manager  Follow up plan: Per pt daughter will discuss with pt and call back to reschedule   Julian Hy, [place] Direct Dial: [phone]

## 2022-11-03 ENCOUNTER — Other Ambulatory Visit: Payer: Self-pay | Admitting: Internal Medicine

## 2022-11-03 ENCOUNTER — Ambulatory Visit: Payer: Medicare Other | Admitting: Adult Health

## 2022-11-04 DIAGNOSIS — M65342 Trigger finger, left ring finger: Secondary | ICD-10-CM | POA: Diagnosis not present

## 2022-11-06 DIAGNOSIS — R42 Dizziness and giddiness: Secondary | ICD-10-CM | POA: Diagnosis not present

## 2022-11-06 DIAGNOSIS — H903 Sensorineural hearing loss, bilateral: Secondary | ICD-10-CM | POA: Diagnosis not present

## 2022-11-10 ENCOUNTER — Ambulatory Visit: Payer: Medicare Other | Admitting: Neurology

## 2022-11-11 ENCOUNTER — Ambulatory Visit (INDEPENDENT_AMBULATORY_CARE_PROVIDER_SITE_OTHER): Payer: Medicare Other

## 2022-11-11 DIAGNOSIS — E538 Deficiency of other specified B group vitamins: Secondary | ICD-10-CM | POA: Diagnosis not present

## 2022-11-11 MED ORDER — CYANOCOBALAMIN 1000 MCG/ML IJ SOLN
1000.0000 ug | Freq: Once | INTRAMUSCULAR | Status: AC
  Administered 2022-11-11: 1000 ug via INTRAMUSCULAR

## 2022-11-11 NOTE — Progress Notes (Signed)
Pt here for 2 monthly B12 injection per Dr. Larose Kells  B12 1035mcg given IM L deltoid, and pt tolerated injection well.  Next B12 injection scheduled for 01/12/2023. Pt will be out of town for the month of February.

## 2022-11-15 ENCOUNTER — Other Ambulatory Visit: Payer: Self-pay | Admitting: Family Medicine

## 2022-11-15 DIAGNOSIS — R42 Dizziness and giddiness: Secondary | ICD-10-CM

## 2022-11-16 MED ORDER — MECLIZINE HCL 25 MG PO TABS: 25.0000 mg | ORAL_TABLET | Freq: Three times a day (TID) | ORAL | 0 refills | Status: AC | PRN

## 2022-11-26 NOTE — Progress Notes (Signed)
  Care Coordination Note  11/26/2022 Name: Madeline Daniel MRN: [id number] DOB: 1940/11/15  Madeline Daniel is a 82 y.o. year old female who is a primary care patient of Colon Branch, MD and is actively engaged with the care management team. I reached out to [name] by phone today to assist with re-scheduling a follow up visit with the RN Case Manager  Follow up plan: We have been unable to make contact with the patient for follow up.   Julian Hy, [place] Direct Dial: [phone]

## 2022-12-12 ENCOUNTER — Other Ambulatory Visit: Payer: Self-pay | Admitting: Internal Medicine

## 2022-12-14 ENCOUNTER — Other Ambulatory Visit: Payer: Self-pay | Admitting: Internal Medicine

## 2022-12-17 DIAGNOSIS — Z96651 Presence of right artificial knee joint: Secondary | ICD-10-CM | POA: Diagnosis not present

## 2022-12-17 DIAGNOSIS — Z96652 Presence of left artificial knee joint: Secondary | ICD-10-CM | POA: Diagnosis not present

## 2022-12-21 DIAGNOSIS — M25562 Pain in left knee: Secondary | ICD-10-CM | POA: Diagnosis not present

## 2022-12-24 DIAGNOSIS — M25462 Effusion, left knee: Secondary | ICD-10-CM | POA: Diagnosis not present

## 2022-12-24 DIAGNOSIS — Z96651 Presence of right artificial knee joint: Secondary | ICD-10-CM | POA: Diagnosis not present

## 2022-12-24 DIAGNOSIS — Z96652 Presence of left artificial knee joint: Secondary | ICD-10-CM | POA: Diagnosis not present

## 2022-12-27 ENCOUNTER — Other Ambulatory Visit: Payer: Self-pay | Admitting: Internal Medicine

## 2023-01-05 ENCOUNTER — Ambulatory Visit: Payer: Medicare Other | Admitting: Adult Health

## 2023-01-06 ENCOUNTER — Encounter: Payer: Self-pay | Admitting: Internal Medicine

## 2023-01-06 ENCOUNTER — Other Ambulatory Visit: Payer: Self-pay | Admitting: Internal Medicine

## 2023-01-06 NOTE — Telephone Encounter (Signed)
Requesting: alprazolam 0.'5mg'$  Contract: 11/03/21 UDS: 08/17/22 Last Visit: 10/13/22 Next Visit: 01/19/23 Last Refill: 09/07/22 #30 and 3RF   Please Advise

## 2023-01-07 MED ORDER — ALPRAZOLAM 0.5 MG PO TABS: 0.2500 mg | ORAL_TABLET | Freq: Every day | ORAL | 3 refills | Status: AC | PRN

## 2023-01-10 ENCOUNTER — Other Ambulatory Visit: Payer: Self-pay | Admitting: Internal Medicine

## 2023-01-12 ENCOUNTER — Ambulatory Visit: Payer: Medicare Other | Admitting: Internal Medicine

## 2023-01-18 ENCOUNTER — Encounter: Payer: Self-pay | Admitting: Internal Medicine

## 2023-01-18 NOTE — Telephone Encounter (Signed)
Appt virtually tomorrow.

## 2023-01-19 ENCOUNTER — Encounter: Payer: Self-pay | Admitting: Internal Medicine

## 2023-01-19 ENCOUNTER — Other Ambulatory Visit: Payer: Medicare Other

## 2023-01-19 ENCOUNTER — Telehealth (INDEPENDENT_AMBULATORY_CARE_PROVIDER_SITE_OTHER): Payer: Medicare Other | Admitting: Internal Medicine

## 2023-01-19 VITALS — BP 156/82 | HR 66 | Ht 59.0 in | Wt 209.0 lb

## 2023-01-19 DIAGNOSIS — U071 COVID-19: Secondary | ICD-10-CM

## 2023-01-19 LAB — BASIC METABOLIC PANEL WITH GFR
BUN: 10 mg/dL (ref 7–25)
CO2: 28 mmol/L (ref 20–32)
Calcium: 8.4 mg/dL — ABNORMAL LOW (ref 8.6–10.4)
Chloride: 97 mmol/L — ABNORMAL LOW (ref 98–110)
Creat: 0.8 mg/dL (ref 0.60–0.95)
Glucose, Bld: 113 mg/dL — ABNORMAL HIGH (ref 65–99)
Potassium: 4 mmol/L (ref 3.5–5.3)
Sodium: 132 mmol/L — ABNORMAL LOW (ref 135–146)

## 2023-01-19 NOTE — Progress Notes (Unsigned)
Subjective:    Patient ID: Madeline Daniel, female    DOB: 11-Jul-1941, 82 y.o.   MRN: [id number]  DOS:  01/19/2023 Type of visit - description: Virtual Visit via Video Note  I connected with the above patient  by a video enabled telemedicine application and verified that I am speaking with the correct person using two identifiers.   Location of patient: home  Location of provider: office  Persons participating in the virtual visit: patient, provider   I discussed the limitations of evaluation and management by telemedicine and the availability of in person appointments. The patient expressed understanding and agreed to proceed.  Acute  Symptoms started 3 days ago: Malaise, sore throat, sinus congestion, some nausea. She admits to a mild cough, vomited once with the onset of symptoms. Has noted some wheezing that decreases with albuterol. No shortness of breath but when asked admits to anterior chest pain worse with cough. She tested positive for COVID yesterday.   Review of Systems See above   Past Medical History:  Diagnosis Date   Anxiety    Arthritis    Atherosclerosis of aorta (HCC)    Barrett's esophagus    Bleeding ulcer 2005   CKD (chronic kidney disease), stage III (HCC)    Depression    Essential tremor    Gait disorder    GERD (gastroesophageal reflux disease)    History of rib fracture    multiple   History of stomach ulcers    Hyperlipidemia    Hypertension    Insomnia    Meniere disease    Nonalcoholic liver disease, chronic    Peptic ulcer disease    TIA (transient ischemic attack)    Vertigo     Past Surgical History:  Procedure Laterality Date   CARPAL TUNNEL RELEASE Right 02/06/2022   CATARACT EXTRACTION Bilateral    CHOLECYSTECTOMY     REPLACEMENT TOTAL KNEE BILATERAL Bilateral     Current Outpatient Medications  Medication Instructions   albuterol (VENTOLIN HFA) 108 (90 Base) MCG/ACT inhaler 1-2 puffs, Inhalation, Every 6 hours PRN    alendronate (FOSAMAX) 70 mg, Oral, Weekly, Take with a full glass of water on an empty stomach. Remain sitting upright for 45-60 mins   ALPRAZolam (XANAX) 0.25-0.5 mg, Oral, Daily PRN   amLODipine (NORVASC) 5 mg, Oral, Daily   amoxicillin (AMOXIL) 500 mg, Oral, Every 8 hours   atorvastatin (LIPITOR) 40 mg, Oral, Daily   DULoxetine (CYMBALTA) 30 MG capsule TAKE 1 CAPSULE BY MOUTH DAILY  WITH 60 MG CAPSULE FOR A TOTAL  DAILY DOSE OF 90 MG   DULoxetine (CYMBALTA) 60 MG capsule TAKE 1 CAPSULE BY MOUTH DAILY  WITH 30MG  CAPSULE TO EQUAL 90MG   DAILY   fluticasone (FLONASE) 50 MCG/ACT nasal spray SPRAY 2 SPRAYS INTO EACH NOSTRIL EVERY DAY   folic acid (FOLVITE) 1 mg, Oral, Daily, (1mg  = 1037mcg)    meclizine (ANTIVERT) 25 mg, Oral, 3 times daily PRN   metoprolol succinate (TOPROL-XL) 150 mg, Oral, Daily, Take with or immediately following a meal   naloxone (NARCAN) nasal spray 4 mg/0.1 mL 1 spray   omeprazole (PRILOSEC) 20 mg, Oral, 2 times daily before meals   ondansetron (ZOFRAN) 4 mg, Oral, Every 8 hours PRN   primidone (MYSOLINE) 100 mg, Oral, Daily at bedtime   propranolol (INDERAL) 20 mg, Oral, 3 times daily PRN   valsartan (DIOVAN) 320 mg, Oral, Daily   Vitamin D3 2,000 Units, Oral, Daily  Objective:   Physical Exam BP (!) 156/82   Pulse 66   Ht 4\' 11"  (1.499 m)   Wt 209 lb (94.8 kg)   SpO2 93%   BMI 42.21 kg/m  This is a virtual video visit, she is sitting in the sofa, looks well, speaking complete sentences, in no physical or mental distress.     Assessment   Assessment (new patient, referred by her daughter [name]) Hyperglycemia HTN  -Edema with amlodipine (tolerating well as of 09/2022) -Off HCTZ due to hyponatremia  -Off hydralazine since 09/28/2022 because poor tolerance. High cholesterol Anxiety depression insomnia GI: -GERD, h/o esophageal dilatations , Barrett's  (see GI note 03/26/2020) -EGD:  04/02/2020: Normal duodenum, suggestion of Barrett's  esophagus, hiatal hernia (dilatation), polyp stomach. Pathology not available -Question of liver cirrhosis, incidental finding on CTA 11/05/2019. NEURO -Essential Tremor, onset age 36s  on primidone -Brain MRI-- volume loss and small vessel ischemic disease. -H./o TIA  -Menire, lack of balance, zofran prn -Gait d/o: due to dizziness >> DJD; uses a walker, h/o frequent fall Bronchospasm  DJD knee-hands-neck , tramadol prn Vitamin D deficiency  Vitamin B12 deficiency (Dx 07-2022) Social: Move with her daughter Abigail Butts from [place], plans to live in [place] during the winters   PLAN: COVID-19: The patient is 50, history of hyperglycemia, hypertension, high cholesterol, question of cirrhosis, no recent COVID-vaccine, presenting with symptoms for the last 3 days. Vital signs stable, O2 sat 93%, yesterday was 95%. Plan: BMP today, proceed with Paxlovid if appropriate. Side effects of Paxlovid discussed, aware of possible rebound, if rebound severe let me know. Otherwise continue with rest, fluids, Tylenol, albuterol. Hold mysoline  Will be contagious for 5 days COVID booster next month    I discussed the assessment and treatment plan with the patient. The patient was provided an opportunity to ask questions and all were answered. The patient agreed with the plan and demonstrated an understanding of the instructions.   The patient was advised to call back or seek an in-person evaluation if the symptoms worsen or if the condition fails to improve as anticipated.

## 2023-01-20 MED ORDER — NIRMATRELVIR/RITONAVIR (PAXLOVID)TABLET: 3.0000 | ORAL_TABLET | Freq: Two times a day (BID) | ORAL | 0 refills | Status: AC

## 2023-01-20 NOTE — Assessment & Plan Note (Signed)
COVID-19: The patient is 43, history of hyperglycemia, hypertension, high cholesterol, question of cirrhosis, no recent COVID-vaccine, presenting with symptoms for the last 3 days. Vital signs stable, O2 sat 93%, yesterday was 95%. Plan: BMP today, proceed with Paxlovid if appropriate. Side effects of Paxlovid discussed, aware of possible rebound, if rebound severe let me know. Otherwise continue with rest, fluids, Tylenol, albuterol. Hold mysoline  Will be contagious for 5 days COVID booster next month

## 2023-01-20 NOTE — Addendum Note (Signed)
Addended byDamita Dunnings D on: 01/20/2023 09:36 AM   Modules accepted: Orders

## 2023-01-29 ENCOUNTER — Ambulatory Visit (INDEPENDENT_AMBULATORY_CARE_PROVIDER_SITE_OTHER): Payer: Medicare Other

## 2023-01-29 DIAGNOSIS — E538 Deficiency of other specified B group vitamins: Secondary | ICD-10-CM | POA: Diagnosis not present

## 2023-01-29 MED ORDER — CYANOCOBALAMIN 1000 MCG/ML IJ SOLN
1000.0000 ug | Freq: Once | INTRAMUSCULAR | Status: AC
  Administered 2023-01-29: 1000 ug via INTRAMUSCULAR

## 2023-01-29 NOTE — Progress Notes (Signed)
Pt here for 3 monthly B12 injection per Dr. Drue Novel  B12 given IM L deltoid, and pt tolerated injection well.  Next B12 injection scheduled for .

## 2023-02-05 ENCOUNTER — Other Ambulatory Visit: Payer: Self-pay | Admitting: Internal Medicine

## 2023-02-15 ENCOUNTER — Other Ambulatory Visit: Payer: Self-pay | Admitting: Internal Medicine

## 2023-03-02 ENCOUNTER — Encounter: Payer: Self-pay | Admitting: Internal Medicine

## 2023-03-02 DIAGNOSIS — R1310 Dysphagia, unspecified: Secondary | ICD-10-CM

## 2023-03-03 ENCOUNTER — Telehealth: Payer: Self-pay | Admitting: Internal Medicine

## 2023-03-03 ENCOUNTER — Ambulatory Visit (INDEPENDENT_AMBULATORY_CARE_PROVIDER_SITE_OTHER): Payer: Medicare Other

## 2023-03-03 DIAGNOSIS — E538 Deficiency of other specified B group vitamins: Secondary | ICD-10-CM | POA: Diagnosis not present

## 2023-03-03 MED ORDER — VALSARTAN 4 MG/ML PO SOLN: 80.0000 mL | Freq: Every day | ORAL | 1 refills | Status: AC

## 2023-03-03 MED ORDER — CYANOCOBALAMIN 1000 MCG/ML IJ SOLN
1000.0000 ug | Freq: Once | INTRAMUSCULAR | Status: AC
Start: 2023-03-03 — End: 2023-03-03
  Administered 2023-03-03: 1000 ug via INTRAMUSCULAR

## 2023-03-03 NOTE — Telephone Encounter (Signed)
-   Refer to GI, Dx dysphagia (has a history of dysphagia, had a EGD 2021, had a dilatation). - Send valsartan Solution 4 mg/mL: 80 mL by mouth daily

## 2023-03-03 NOTE — Progress Notes (Signed)
Pt here for monthly B12 injection per PAZ  B12 given IM, and pt tolerated injection well.   Pt states going to [place] and will call back when she is back in town

## 2023-03-03 NOTE — Telephone Encounter (Signed)
Rx sent. GI referral placed.  ?

## 2023-03-03 NOTE — Telephone Encounter (Signed)
PA initiated via Covermymeds; KEY: [id number]. Awaiting determination.

## 2023-03-03 NOTE — Addendum Note (Signed)
Addended byConrad Langley D on: 03/03/2023 08:29 AM   Modules accepted: Orders

## 2023-03-04 ENCOUNTER — Other Ambulatory Visit: Payer: Self-pay | Admitting: Internal Medicine

## 2023-03-04 NOTE — Telephone Encounter (Signed)
Further PA questions received, form completed and faxed back to [facility] at [phone]. Awaiting determination.

## 2023-03-05 ENCOUNTER — Telehealth: Payer: Self-pay | Admitting: Internal Medicine

## 2023-03-05 NOTE — Telephone Encounter (Signed)
PA approved.   Request Reference Number: [id number]. VALSARTAN SOL 20MG /5ML is approved through 10/26/2023. Your patient may now fill this prescription and it will be covered. Authorization Expiration Date: 10/26/2023

## 2023-03-11 DIAGNOSIS — Z96651 Presence of right artificial knee joint: Secondary | ICD-10-CM | POA: Diagnosis not present

## 2023-03-11 DIAGNOSIS — J029 Acute pharyngitis, unspecified: Secondary | ICD-10-CM | POA: Diagnosis not present

## 2023-03-11 DIAGNOSIS — W010XXA Fall on same level from slipping, tripping and stumbling without subsequent striking against object, initial encounter: Secondary | ICD-10-CM | POA: Diagnosis not present

## 2023-03-11 DIAGNOSIS — B370 Candidal stomatitis: Secondary | ICD-10-CM | POA: Diagnosis not present

## 2023-03-11 DIAGNOSIS — T84033A Mechanical loosening of internal left knee prosthetic joint, initial encounter: Secondary | ICD-10-CM | POA: Diagnosis not present

## 2023-03-11 DIAGNOSIS — R490 Dysphonia: Secondary | ICD-10-CM | POA: Diagnosis not present

## 2023-03-11 DIAGNOSIS — T84032A Mechanical loosening of internal right knee prosthetic joint, initial encounter: Secondary | ICD-10-CM | POA: Diagnosis not present

## 2023-03-11 DIAGNOSIS — K2270 Barrett's esophagus without dysplasia: Secondary | ICD-10-CM | POA: Diagnosis not present

## 2023-03-11 DIAGNOSIS — R1319 Other dysphagia: Secondary | ICD-10-CM | POA: Diagnosis not present

## 2023-03-11 DIAGNOSIS — Z96652 Presence of left artificial knee joint: Secondary | ICD-10-CM | POA: Diagnosis not present

## 2023-03-17 ENCOUNTER — Encounter: Payer: Self-pay | Admitting: Internal Medicine

## 2023-03-17 DIAGNOSIS — E1122 Type 2 diabetes mellitus with diabetic chronic kidney disease: Secondary | ICD-10-CM | POA: Diagnosis not present

## 2023-03-17 DIAGNOSIS — D519 Vitamin B12 deficiency anemia, unspecified: Secondary | ICD-10-CM | POA: Diagnosis not present

## 2023-03-17 DIAGNOSIS — N1831 Chronic kidney disease, stage 3a: Secondary | ICD-10-CM | POA: Diagnosis not present

## 2023-03-17 DIAGNOSIS — E871 Hypo-osmolality and hyponatremia: Secondary | ICD-10-CM | POA: Diagnosis not present

## 2023-03-17 DIAGNOSIS — I1310 Hypertensive heart and chronic kidney disease without heart failure, with stage 1 through stage 4 chronic kidney disease, or unspecified chronic kidney disease: Secondary | ICD-10-CM | POA: Diagnosis not present

## 2023-03-17 DIAGNOSIS — E639 Nutritional deficiency, unspecified: Secondary | ICD-10-CM | POA: Diagnosis not present

## 2023-03-18 DIAGNOSIS — J948 Other specified pleural conditions: Secondary | ICD-10-CM | POA: Diagnosis not present

## 2023-03-18 DIAGNOSIS — M479 Spondylosis, unspecified: Secondary | ICD-10-CM | POA: Diagnosis not present

## 2023-03-18 DIAGNOSIS — K7689 Other specified diseases of liver: Secondary | ICD-10-CM | POA: Diagnosis not present

## 2023-03-18 DIAGNOSIS — K7460 Unspecified cirrhosis of liver: Secondary | ICD-10-CM | POA: Diagnosis not present

## 2023-03-18 DIAGNOSIS — I708 Atherosclerosis of other arteries: Secondary | ICD-10-CM | POA: Diagnosis not present

## 2023-03-18 DIAGNOSIS — K5730 Diverticulosis of large intestine without perforation or abscess without bleeding: Secondary | ICD-10-CM | POA: Diagnosis not present

## 2023-03-18 DIAGNOSIS — K449 Diaphragmatic hernia without obstruction or gangrene: Secondary | ICD-10-CM | POA: Diagnosis not present

## 2023-03-18 DIAGNOSIS — S2242XA Multiple fractures of ribs, left side, initial encounter for closed fracture: Secondary | ICD-10-CM | POA: Diagnosis not present

## 2023-03-18 DIAGNOSIS — I2510 Atherosclerotic heart disease of native coronary artery without angina pectoris: Secondary | ICD-10-CM | POA: Diagnosis not present

## 2023-03-18 DIAGNOSIS — I700 Atherosclerosis of aorta: Secondary | ICD-10-CM | POA: Diagnosis not present

## 2023-03-18 DIAGNOSIS — N281 Cyst of kidney, acquired: Secondary | ICD-10-CM | POA: Diagnosis not present

## 2023-03-18 DIAGNOSIS — R932 Abnormal findings on diagnostic imaging of liver and biliary tract: Secondary | ICD-10-CM | POA: Diagnosis not present

## 2023-03-18 DIAGNOSIS — J8410 Pulmonary fibrosis, unspecified: Secondary | ICD-10-CM | POA: Diagnosis not present

## 2023-03-18 DIAGNOSIS — Z9049 Acquired absence of other specified parts of digestive tract: Secondary | ICD-10-CM | POA: Diagnosis not present

## 2023-03-18 DIAGNOSIS — M4854XA Collapsed vertebra, not elsewhere classified, thoracic region, initial encounter for fracture: Secondary | ICD-10-CM | POA: Diagnosis not present

## 2023-03-27 DIAGNOSIS — R490 Dysphonia: Secondary | ICD-10-CM | POA: Diagnosis not present

## 2023-03-27 DIAGNOSIS — J029 Acute pharyngitis, unspecified: Secondary | ICD-10-CM | POA: Diagnosis not present

## 2023-03-27 DIAGNOSIS — K2270 Barrett's esophagus without dysplasia: Secondary | ICD-10-CM | POA: Diagnosis not present

## 2023-03-27 DIAGNOSIS — B370 Candidal stomatitis: Secondary | ICD-10-CM | POA: Diagnosis not present

## 2023-03-30 DIAGNOSIS — M1990 Unspecified osteoarthritis, unspecified site: Secondary | ICD-10-CM | POA: Diagnosis not present

## 2023-03-30 DIAGNOSIS — N1831 Chronic kidney disease, stage 3a: Secondary | ICD-10-CM | POA: Diagnosis not present

## 2023-03-30 DIAGNOSIS — E1122 Type 2 diabetes mellitus with diabetic chronic kidney disease: Secondary | ICD-10-CM | POA: Diagnosis not present

## 2023-03-30 DIAGNOSIS — Z96652 Presence of left artificial knee joint: Secondary | ICD-10-CM | POA: Diagnosis not present

## 2023-03-30 DIAGNOSIS — E871 Hypo-osmolality and hyponatremia: Secondary | ICD-10-CM | POA: Diagnosis not present

## 2023-03-30 DIAGNOSIS — R9089 Other abnormal findings on diagnostic imaging of central nervous system: Secondary | ICD-10-CM | POA: Diagnosis not present

## 2023-03-30 DIAGNOSIS — I1310 Hypertensive heart and chronic kidney disease without heart failure, with stage 1 through stage 4 chronic kidney disease, or unspecified chronic kidney disease: Secondary | ICD-10-CM | POA: Diagnosis not present

## 2023-03-30 DIAGNOSIS — Z96653 Presence of artificial knee joint, bilateral: Secondary | ICD-10-CM | POA: Diagnosis not present

## 2023-03-30 DIAGNOSIS — I700 Atherosclerosis of aorta: Secondary | ICD-10-CM | POA: Diagnosis not present

## 2023-03-30 DIAGNOSIS — Z01818 Encounter for other preprocedural examination: Secondary | ICD-10-CM | POA: Diagnosis not present

## 2023-03-30 DIAGNOSIS — R5383 Other fatigue: Secondary | ICD-10-CM | POA: Diagnosis not present

## 2023-03-30 DIAGNOSIS — M1712 Unilateral primary osteoarthritis, left knee: Secondary | ICD-10-CM | POA: Diagnosis not present

## 2023-03-30 DIAGNOSIS — D519 Vitamin B12 deficiency anemia, unspecified: Secondary | ICD-10-CM | POA: Diagnosis not present

## 2023-04-01 DIAGNOSIS — T84032D Mechanical loosening of internal right knee prosthetic joint, subsequent encounter: Secondary | ICD-10-CM | POA: Diagnosis not present

## 2023-04-01 DIAGNOSIS — M6281 Muscle weakness (generalized): Secondary | ICD-10-CM | POA: Diagnosis not present

## 2023-04-01 DIAGNOSIS — T84033D Mechanical loosening of internal left knee prosthetic joint, subsequent encounter: Secondary | ICD-10-CM | POA: Diagnosis not present

## 2023-04-05 DIAGNOSIS — T84033D Mechanical loosening of internal left knee prosthetic joint, subsequent encounter: Secondary | ICD-10-CM | POA: Diagnosis not present

## 2023-04-05 DIAGNOSIS — T84032D Mechanical loosening of internal right knee prosthetic joint, subsequent encounter: Secondary | ICD-10-CM | POA: Diagnosis not present

## 2023-04-05 DIAGNOSIS — M6281 Muscle weakness (generalized): Secondary | ICD-10-CM | POA: Diagnosis not present

## 2023-04-08 DIAGNOSIS — K7460 Unspecified cirrhosis of liver: Secondary | ICD-10-CM | POA: Diagnosis not present

## 2023-04-08 DIAGNOSIS — D131 Benign neoplasm of stomach: Secondary | ICD-10-CM | POA: Diagnosis not present

## 2023-04-08 DIAGNOSIS — K219 Gastro-esophageal reflux disease without esophagitis: Secondary | ICD-10-CM | POA: Diagnosis not present

## 2023-04-08 DIAGNOSIS — K449 Diaphragmatic hernia without obstruction or gangrene: Secondary | ICD-10-CM | POA: Diagnosis not present

## 2023-04-08 DIAGNOSIS — K2270 Barrett's esophagus without dysplasia: Secondary | ICD-10-CM | POA: Diagnosis not present

## 2023-04-08 DIAGNOSIS — I10 Essential (primary) hypertension: Secondary | ICD-10-CM | POA: Diagnosis not present

## 2023-04-08 DIAGNOSIS — R1319 Other dysphagia: Secondary | ICD-10-CM | POA: Diagnosis not present

## 2023-04-08 DIAGNOSIS — K2970 Gastritis, unspecified, without bleeding: Secondary | ICD-10-CM | POA: Diagnosis not present

## 2023-04-08 DIAGNOSIS — E785 Hyperlipidemia, unspecified: Secondary | ICD-10-CM | POA: Diagnosis not present

## 2023-04-08 DIAGNOSIS — F32A Depression, unspecified: Secondary | ICD-10-CM | POA: Diagnosis not present

## 2023-04-13 DIAGNOSIS — G4700 Insomnia, unspecified: Secondary | ICD-10-CM | POA: Diagnosis not present

## 2023-04-13 DIAGNOSIS — T84032D Mechanical loosening of internal right knee prosthetic joint, subsequent encounter: Secondary | ICD-10-CM | POA: Diagnosis not present

## 2023-04-13 DIAGNOSIS — I1310 Hypertensive heart and chronic kidney disease without heart failure, with stage 1 through stage 4 chronic kidney disease, or unspecified chronic kidney disease: Secondary | ICD-10-CM | POA: Diagnosis not present

## 2023-04-13 DIAGNOSIS — G4489 Other headache syndrome: Secondary | ICD-10-CM | POA: Diagnosis not present

## 2023-04-13 DIAGNOSIS — R5383 Other fatigue: Secondary | ICD-10-CM | POA: Diagnosis not present

## 2023-04-13 DIAGNOSIS — S060X0D Concussion without loss of consciousness, subsequent encounter: Secondary | ICD-10-CM | POA: Diagnosis not present

## 2023-04-13 DIAGNOSIS — M6281 Muscle weakness (generalized): Secondary | ICD-10-CM | POA: Diagnosis not present

## 2023-04-13 DIAGNOSIS — T84033D Mechanical loosening of internal left knee prosthetic joint, subsequent encounter: Secondary | ICD-10-CM | POA: Diagnosis not present

## 2023-04-13 DIAGNOSIS — K2270 Barrett's esophagus without dysplasia: Secondary | ICD-10-CM | POA: Diagnosis not present

## 2023-04-16 DIAGNOSIS — M6281 Muscle weakness (generalized): Secondary | ICD-10-CM | POA: Diagnosis not present

## 2023-04-16 DIAGNOSIS — T84032D Mechanical loosening of internal right knee prosthetic joint, subsequent encounter: Secondary | ICD-10-CM | POA: Diagnosis not present

## 2023-04-16 DIAGNOSIS — T84033D Mechanical loosening of internal left knee prosthetic joint, subsequent encounter: Secondary | ICD-10-CM | POA: Diagnosis not present

## 2023-04-20 DIAGNOSIS — T84032D Mechanical loosening of internal right knee prosthetic joint, subsequent encounter: Secondary | ICD-10-CM | POA: Diagnosis not present

## 2023-04-20 DIAGNOSIS — M6281 Muscle weakness (generalized): Secondary | ICD-10-CM | POA: Diagnosis not present

## 2023-04-20 DIAGNOSIS — T84033D Mechanical loosening of internal left knee prosthetic joint, subsequent encounter: Secondary | ICD-10-CM | POA: Diagnosis not present

## 2023-04-24 DIAGNOSIS — S0231XA Fracture of orbital floor, right side, initial encounter for closed fracture: Secondary | ICD-10-CM | POA: Diagnosis not present

## 2023-04-24 DIAGNOSIS — G319 Degenerative disease of nervous system, unspecified: Secondary | ICD-10-CM | POA: Diagnosis not present

## 2023-04-24 DIAGNOSIS — S060X0D Concussion without loss of consciousness, subsequent encounter: Secondary | ICD-10-CM | POA: Diagnosis not present

## 2023-04-24 DIAGNOSIS — D1779 Benign lipomatous neoplasm of other sites: Secondary | ICD-10-CM | POA: Diagnosis not present

## 2023-04-27 DIAGNOSIS — M6281 Muscle weakness (generalized): Secondary | ICD-10-CM | POA: Diagnosis not present

## 2023-04-27 DIAGNOSIS — T84032D Mechanical loosening of internal right knee prosthetic joint, subsequent encounter: Secondary | ICD-10-CM | POA: Diagnosis not present

## 2023-04-27 DIAGNOSIS — T84033D Mechanical loosening of internal left knee prosthetic joint, subsequent encounter: Secondary | ICD-10-CM | POA: Diagnosis not present

## 2023-04-30 DIAGNOSIS — M6281 Muscle weakness (generalized): Secondary | ICD-10-CM | POA: Diagnosis not present

## 2023-04-30 DIAGNOSIS — T84032D Mechanical loosening of internal right knee prosthetic joint, subsequent encounter: Secondary | ICD-10-CM | POA: Diagnosis not present

## 2023-04-30 DIAGNOSIS — G4700 Insomnia, unspecified: Secondary | ICD-10-CM | POA: Diagnosis not present

## 2023-04-30 DIAGNOSIS — E871 Hypo-osmolality and hyponatremia: Secondary | ICD-10-CM | POA: Diagnosis not present

## 2023-04-30 DIAGNOSIS — T84033D Mechanical loosening of internal left knee prosthetic joint, subsequent encounter: Secondary | ICD-10-CM | POA: Diagnosis not present

## 2023-04-30 DIAGNOSIS — G4489 Other headache syndrome: Secondary | ICD-10-CM | POA: Diagnosis not present

## 2023-05-11 DIAGNOSIS — T84033D Mechanical loosening of internal left knee prosthetic joint, subsequent encounter: Secondary | ICD-10-CM | POA: Diagnosis not present

## 2023-05-11 DIAGNOSIS — T84032D Mechanical loosening of internal right knee prosthetic joint, subsequent encounter: Secondary | ICD-10-CM | POA: Diagnosis not present

## 2023-05-11 DIAGNOSIS — M6281 Muscle weakness (generalized): Secondary | ICD-10-CM | POA: Diagnosis not present

## 2023-05-14 ENCOUNTER — Other Ambulatory Visit: Payer: Self-pay | Admitting: *Deleted

## 2023-05-14 ENCOUNTER — Encounter: Payer: Self-pay | Admitting: *Deleted

## 2023-05-17 DIAGNOSIS — M65342 Trigger finger, left ring finger: Secondary | ICD-10-CM | POA: Diagnosis not present

## 2023-05-17 DIAGNOSIS — M7521 Bicipital tendinitis, right shoulder: Secondary | ICD-10-CM | POA: Diagnosis not present

## 2023-05-18 ENCOUNTER — Other Ambulatory Visit: Payer: Self-pay

## 2023-05-18 DIAGNOSIS — I1310 Hypertensive heart and chronic kidney disease without heart failure, with stage 1 through stage 4 chronic kidney disease, or unspecified chronic kidney disease: Secondary | ICD-10-CM | POA: Diagnosis not present

## 2023-05-18 DIAGNOSIS — N1831 Chronic kidney disease, stage 3a: Secondary | ICD-10-CM | POA: Diagnosis not present

## 2023-05-18 DIAGNOSIS — T84033D Mechanical loosening of internal left knee prosthetic joint, subsequent encounter: Secondary | ICD-10-CM | POA: Diagnosis not present

## 2023-05-18 DIAGNOSIS — E1122 Type 2 diabetes mellitus with diabetic chronic kidney disease: Secondary | ICD-10-CM | POA: Diagnosis not present

## 2023-05-18 DIAGNOSIS — Z0000 Encounter for general adult medical examination without abnormal findings: Secondary | ICD-10-CM | POA: Diagnosis not present

## 2023-05-18 DIAGNOSIS — T84032D Mechanical loosening of internal right knee prosthetic joint, subsequent encounter: Secondary | ICD-10-CM | POA: Diagnosis not present

## 2023-05-18 DIAGNOSIS — I700 Atherosclerosis of aorta: Secondary | ICD-10-CM | POA: Diagnosis not present

## 2023-05-18 DIAGNOSIS — Z23 Encounter for immunization: Secondary | ICD-10-CM | POA: Diagnosis not present

## 2023-05-20 DIAGNOSIS — B3731 Acute candidiasis of vulva and vagina: Secondary | ICD-10-CM | POA: Diagnosis not present

## 2023-05-20 DIAGNOSIS — N390 Urinary tract infection, site not specified: Secondary | ICD-10-CM | POA: Diagnosis not present

## 2023-05-25 DIAGNOSIS — M7521 Bicipital tendinitis, right shoulder: Secondary | ICD-10-CM | POA: Diagnosis not present

## 2023-05-25 DIAGNOSIS — T84032D Mechanical loosening of internal right knee prosthetic joint, subsequent encounter: Secondary | ICD-10-CM | POA: Diagnosis not present

## 2023-05-25 DIAGNOSIS — M65342 Trigger finger, left ring finger: Secondary | ICD-10-CM | POA: Diagnosis not present

## 2023-05-25 DIAGNOSIS — T84033D Mechanical loosening of internal left knee prosthetic joint, subsequent encounter: Secondary | ICD-10-CM | POA: Diagnosis not present

## 2023-06-22 ENCOUNTER — Encounter: Payer: Self-pay | Admitting: *Deleted

## 2023-06-25 DIAGNOSIS — M65342 Trigger finger, left ring finger: Secondary | ICD-10-CM | POA: Diagnosis not present

## 2023-07-06 DIAGNOSIS — M7521 Bicipital tendinitis, right shoulder: Secondary | ICD-10-CM | POA: Diagnosis not present

## 2023-07-06 DIAGNOSIS — M5412 Radiculopathy, cervical region: Secondary | ICD-10-CM | POA: Diagnosis not present

## 2023-07-28 DIAGNOSIS — M5412 Radiculopathy, cervical region: Secondary | ICD-10-CM | POA: Diagnosis not present

## 2023-08-02 DIAGNOSIS — G4700 Insomnia, unspecified: Secondary | ICD-10-CM | POA: Diagnosis not present

## 2023-08-02 DIAGNOSIS — Z23 Encounter for immunization: Secondary | ICD-10-CM | POA: Diagnosis not present

## 2023-08-02 DIAGNOSIS — D519 Vitamin B12 deficiency anemia, unspecified: Secondary | ICD-10-CM | POA: Diagnosis not present

## 2023-08-08 DIAGNOSIS — M542 Cervicalgia: Secondary | ICD-10-CM | POA: Diagnosis not present

## 2023-08-08 DIAGNOSIS — S0990XA Unspecified injury of head, initial encounter: Secondary | ICD-10-CM | POA: Diagnosis not present

## 2023-08-08 DIAGNOSIS — W01198A Fall on same level from slipping, tripping and stumbling with subsequent striking against other object, initial encounter: Secondary | ICD-10-CM | POA: Diagnosis not present

## 2023-08-08 DIAGNOSIS — Z5181 Encounter for therapeutic drug level monitoring: Secondary | ICD-10-CM | POA: Diagnosis not present

## 2023-08-08 DIAGNOSIS — S098XXA Other specified injuries of head, initial encounter: Secondary | ICD-10-CM | POA: Diagnosis not present

## 2023-08-08 DIAGNOSIS — I700 Atherosclerosis of aorta: Secondary | ICD-10-CM | POA: Diagnosis not present

## 2023-08-08 DIAGNOSIS — S60221A Contusion of right hand, initial encounter: Secondary | ICD-10-CM | POA: Diagnosis not present

## 2023-08-08 DIAGNOSIS — K5730 Diverticulosis of large intestine without perforation or abscess without bleeding: Secondary | ICD-10-CM | POA: Diagnosis not present

## 2023-08-08 DIAGNOSIS — Z79899 Other long term (current) drug therapy: Secondary | ICD-10-CM | POA: Diagnosis not present

## 2023-08-08 DIAGNOSIS — R296 Repeated falls: Secondary | ICD-10-CM | POA: Diagnosis not present

## 2023-08-08 DIAGNOSIS — M4313 Spondylolisthesis, cervicothoracic region: Secondary | ICD-10-CM | POA: Diagnosis not present

## 2023-08-08 DIAGNOSIS — W1830XA Fall on same level, unspecified, initial encounter: Secondary | ICD-10-CM | POA: Diagnosis not present

## 2023-08-08 DIAGNOSIS — I10 Essential (primary) hypertension: Secondary | ICD-10-CM | POA: Diagnosis not present

## 2023-08-08 DIAGNOSIS — M545 Low back pain, unspecified: Secondary | ICD-10-CM | POA: Diagnosis not present

## 2023-08-08 DIAGNOSIS — M47813 Spondylosis without myelopathy or radiculopathy, cervicothoracic region: Secondary | ICD-10-CM | POA: Diagnosis not present

## 2023-08-08 DIAGNOSIS — I4510 Unspecified right bundle-branch block: Secondary | ICD-10-CM | POA: Diagnosis not present

## 2023-08-08 DIAGNOSIS — R42 Dizziness and giddiness: Secondary | ICD-10-CM | POA: Diagnosis not present

## 2023-08-08 DIAGNOSIS — M5136 Other intervertebral disc degeneration, lumbar region with discogenic back pain only: Secondary | ICD-10-CM | POA: Diagnosis not present

## 2023-08-08 DIAGNOSIS — R9431 Abnormal electrocardiogram [ECG] [EKG]: Secondary | ICD-10-CM | POA: Diagnosis not present

## 2023-08-08 DIAGNOSIS — I493 Ventricular premature depolarization: Secondary | ICD-10-CM | POA: Diagnosis not present

## 2023-08-08 DIAGNOSIS — M47816 Spondylosis without myelopathy or radiculopathy, lumbar region: Secondary | ICD-10-CM | POA: Diagnosis not present

## 2023-08-08 DIAGNOSIS — G319 Degenerative disease of nervous system, unspecified: Secondary | ICD-10-CM | POA: Diagnosis not present

## 2023-08-08 DIAGNOSIS — Z043 Encounter for examination and observation following other accident: Secondary | ICD-10-CM | POA: Diagnosis not present

## 2023-08-08 DIAGNOSIS — R519 Headache, unspecified: Secondary | ICD-10-CM | POA: Diagnosis not present

## 2023-12-13 DIAGNOSIS — E1122 Type 2 diabetes mellitus with diabetic chronic kidney disease: Secondary | ICD-10-CM | POA: Diagnosis not present

## 2023-12-13 DIAGNOSIS — J449 Chronic obstructive pulmonary disease, unspecified: Secondary | ICD-10-CM | POA: Diagnosis not present

## 2023-12-13 DIAGNOSIS — I1310 Hypertensive heart and chronic kidney disease without heart failure, with stage 1 through stage 4 chronic kidney disease, or unspecified chronic kidney disease: Secondary | ICD-10-CM | POA: Diagnosis not present

## 2023-12-13 DIAGNOSIS — I4510 Unspecified right bundle-branch block: Secondary | ICD-10-CM | POA: Diagnosis not present

## 2023-12-13 DIAGNOSIS — H8103 Meniere's disease, bilateral: Secondary | ICD-10-CM | POA: Diagnosis not present

## 2023-12-13 DIAGNOSIS — N1831 Chronic kidney disease, stage 3a: Secondary | ICD-10-CM | POA: Diagnosis not present

## 2023-12-18 DIAGNOSIS — R300 Dysuria: Secondary | ICD-10-CM | POA: Diagnosis not present

## 2023-12-18 NOTE — Progress Notes (Signed)
 [facility] [address] [place] [address] Dept: [phone] Dept Fax: [phone] Loc: [phone]  Subjective  Madeline Daniel is a 83 y.o. year old who presents to the office with the following complaint(s):  Chief Complaint  Patient presents with  . UTI    Frequency urination, x 3 days.     Subjective  HPI: Here for possible UTI Sxs for 3 days - freq - urg - dysuria - hematuria No vaginal complaints: itch, discharge, bleeding LMP: post menopausal No abd pain No back/flank pain No fever No chills No rash  + h/o UTI - last one was around fall and she only had grumpy symptoms but no urinary symptoms.  Non-DM  Patient states that she has noticed here in the last couple days she has been slightly more grumpy than normal and last time she was like this, she did end up having a urinary tract infection.  Patient comes in today with this concern.  She states she is never really had a UTI that has had classic symptoms.   Review of Systems  Constitutional:  Negative for chills and fever.       +grumpy  Gastrointestinal:  Negative for abdominal pain, nausea and vomiting.  Genitourinary:  Negative for dysuria, flank pain, frequency, hematuria and urgency.  Musculoskeletal:  Negative for back pain.    No Known Allergies Current Outpatient Medications on File Prior to Visit  Medication Sig Dispense Refill  . amLODIPine  (Norvasc ) 10 MG tablet Take by mouth.    SABRA atorvastatin (Lipitor) 40 MG tablet Take 1 tablet by mouth daily.    . DULoxetine  (Cymbalta ) 60 MG DR capsule Take by mouth.    . folic acid  (Folvite ) 1 MG tablet Take 1 mg by mouth.    . levothyroxine (Synthroid, Levoxyl) 50 MCG tablet Take by mouth.    . metFORMIN XR (Glucophage-XR) 500 MG 24 hr tablet     . metoprolol  succinate XL (Toprol -XL) 200 MG 24 hr tablet Take by mouth.    . primidone (Mysoline) 50 MG tablet Take by mouth.    .  spironolactone (Aldactone) 25 MG tablet Take by mouth.    . traZODone (Desyrel) 50 MG tablet Take by mouth.    . valsartan  (Diovan ) 320 MG tablet Take by mouth.    . hydroCHLOROthiazide 12.5 MG tablet Take 1 tablet by mouth daily. (Patient not taking: Reported on 12/18/2023)     No current facility-administered medications on file prior to visit.    Patient Active Problem List  Diagnosis  . Encounter for postoperative care  . RUQ pain  . Alcoholism (CMS/HCC) (HCC)  . Biliary dyskinesia  . Gallbladder sludge  . Benign essential tremor  . Alcoholic liver disease (HCC)    Social History   Tobacco Use  . Smoking status: Never  . Smokeless tobacco: Never  Substance Use Topics  . Alcohol use: Yes       Objective  Objective BP 133/59 (BP Location: Left arm, Patient Position: Sitting, BP Cuff Size: Adult)   Pulse 73   Temp 36.3 C (97.3 F) (Infrared)   Ht 5' (1.524 m)   Wt 219 lb (99.3 kg)   LMP  (LMP Unknown)   SpO2 97%   BMI 42.77 kg/m  Physical Exam Vitals and nursing note reviewed.  Constitutional:      General: She is awake. She is not in acute distress.    Appearance: Normal appearance. She  is not ill-appearing, toxic-appearing or diaphoretic.  HENT:     Head: Normocephalic and atraumatic.  Eyes:     Conjunctiva/sclera: Conjunctivae normal.  Pulmonary:     Effort: Pulmonary effort is normal.  Abdominal:     Tenderness: There is no right CVA tenderness or left CVA tenderness.  Musculoskeletal:        General: Normal range of motion.     Cervical back: Normal range of motion and neck supple.  Skin:    General: Skin is warm.  Neurological:     General: No focal deficit present.     Mental Status: She is alert and oriented to person, place, and time.  Psychiatric:        Mood and Affect: Mood normal.        Behavior: Behavior is cooperative.       Assessment/Plan  1. Suspected UTI -     cephalexin (Keflex) 500 MG capsule; Take 1 capsule (500 mg) by mouth 2  times daily for 7 days., Starting Sat 12/18/2023, Until Sat 12/25/2023, Normal 2. UTI symptoms -     AMB POC URINALYSIS DIP STICK AUTO W/O MICRO -     Urine culture    Patient was informed that she did have trace leukocytes and based on this as well as her atypical symptoms of UTI, we will start her on antibiotics but send over a urine culture to ensure that this is what is going on.  If for some reason urine culture does not show bacteria, patient was informed she must follow-up with her PCP for further evaluation.  Patient and visitor both state understanding is agreeable to above plan of care.  Patient given educational materials - see patient instructions.  Discussed use, benefit, and side effects of prescribed medications.  All patient questions answered.  Pt voiced understanding and agrees with treatmentplan.Follow up as directed.  Dragon was used to dictate this note.  Rosaline Sands, APRN - NP 12/18/2023 4:40 PM

## 2024-01-25 DIAGNOSIS — M5412 Radiculopathy, cervical region: Secondary | ICD-10-CM | POA: Diagnosis not present

## 2024-01-25 DIAGNOSIS — M9971 Connective tissue and disc stenosis of intervertebral foramina of cervical region: Secondary | ICD-10-CM | POA: Diagnosis not present

## 2024-02-14 DIAGNOSIS — R748 Abnormal levels of other serum enzymes: Secondary | ICD-10-CM | POA: Diagnosis not present

## 2024-02-14 DIAGNOSIS — R112 Nausea with vomiting, unspecified: Secondary | ICD-10-CM | POA: Diagnosis not present

## 2024-02-14 DIAGNOSIS — Z7409 Other reduced mobility: Secondary | ICD-10-CM | POA: Diagnosis not present

## 2024-02-14 DIAGNOSIS — Z5181 Encounter for therapeutic drug level monitoring: Secondary | ICD-10-CM | POA: Diagnosis not present

## 2024-02-14 DIAGNOSIS — E878 Other disorders of electrolyte and fluid balance, not elsewhere classified: Secondary | ICD-10-CM | POA: Diagnosis not present

## 2024-02-14 DIAGNOSIS — R7989 Other specified abnormal findings of blood chemistry: Secondary | ICD-10-CM | POA: Diagnosis not present

## 2024-02-14 DIAGNOSIS — E871 Hypo-osmolality and hyponatremia: Secondary | ICD-10-CM | POA: Diagnosis not present

## 2024-02-14 DIAGNOSIS — R918 Other nonspecific abnormal finding of lung field: Secondary | ICD-10-CM | POA: Diagnosis not present

## 2024-02-14 DIAGNOSIS — Z79899 Other long term (current) drug therapy: Secondary | ICD-10-CM | POA: Diagnosis not present

## 2024-02-14 DIAGNOSIS — R531 Weakness: Secondary | ICD-10-CM | POA: Diagnosis not present

## 2024-02-14 DIAGNOSIS — R079 Chest pain, unspecified: Secondary | ICD-10-CM | POA: Diagnosis not present

## 2024-02-14 DIAGNOSIS — D72829 Elevated white blood cell count, unspecified: Secondary | ICD-10-CM | POA: Diagnosis not present

## 2024-02-22 DIAGNOSIS — R112 Nausea with vomiting, unspecified: Secondary | ICD-10-CM | POA: Diagnosis not present

## 2024-02-22 DIAGNOSIS — N1831 Chronic kidney disease, stage 3a: Secondary | ICD-10-CM | POA: Diagnosis not present

## 2024-02-22 DIAGNOSIS — G894 Chronic pain syndrome: Secondary | ICD-10-CM | POA: Diagnosis not present

## 2024-03-31 DIAGNOSIS — E119 Type 2 diabetes mellitus without complications: Secondary | ICD-10-CM | POA: Diagnosis not present

## 2024-03-31 DIAGNOSIS — G4700 Insomnia, unspecified: Secondary | ICD-10-CM | POA: Diagnosis not present

## 2024-03-31 DIAGNOSIS — H6122 Impacted cerumen, left ear: Secondary | ICD-10-CM | POA: Diagnosis not present

## 2024-05-02 DIAGNOSIS — E039 Hypothyroidism, unspecified: Secondary | ICD-10-CM | POA: Diagnosis not present

## 2024-05-03 DIAGNOSIS — K7581 Nonalcoholic steatohepatitis (NASH): Secondary | ICD-10-CM | POA: Diagnosis not present

## 2024-05-03 DIAGNOSIS — K51911 Ulcerative colitis, unspecified with rectal bleeding: Secondary | ICD-10-CM | POA: Diagnosis not present

## 2024-05-03 DIAGNOSIS — E875 Hyperkalemia: Secondary | ICD-10-CM | POA: Diagnosis not present

## 2024-05-03 DIAGNOSIS — G8929 Other chronic pain: Secondary | ICD-10-CM | POA: Diagnosis not present

## 2024-05-03 DIAGNOSIS — E039 Hypothyroidism, unspecified: Secondary | ICD-10-CM | POA: Diagnosis not present

## 2024-05-03 DIAGNOSIS — F5101 Primary insomnia: Secondary | ICD-10-CM | POA: Diagnosis not present

## 2024-05-03 DIAGNOSIS — R600 Localized edema: Secondary | ICD-10-CM | POA: Diagnosis not present

## 2024-05-03 DIAGNOSIS — I10 Essential (primary) hypertension: Secondary | ICD-10-CM | POA: Diagnosis not present

## 2024-05-04 DIAGNOSIS — I700 Atherosclerosis of aorta: Secondary | ICD-10-CM | POA: Diagnosis not present

## 2024-05-04 DIAGNOSIS — E118 Type 2 diabetes mellitus with unspecified complications: Secondary | ICD-10-CM | POA: Diagnosis not present

## 2024-05-09 DIAGNOSIS — R278 Other lack of coordination: Secondary | ICD-10-CM | POA: Diagnosis not present

## 2024-05-09 DIAGNOSIS — M25562 Pain in left knee: Secondary | ICD-10-CM | POA: Diagnosis not present

## 2024-05-09 DIAGNOSIS — M6281 Muscle weakness (generalized): Secondary | ICD-10-CM | POA: Diagnosis not present

## 2024-05-09 DIAGNOSIS — R2681 Unsteadiness on feet: Secondary | ICD-10-CM | POA: Diagnosis not present

## 2024-05-10 DIAGNOSIS — M6281 Muscle weakness (generalized): Secondary | ICD-10-CM | POA: Diagnosis not present

## 2024-05-10 DIAGNOSIS — M25562 Pain in left knee: Secondary | ICD-10-CM | POA: Diagnosis not present

## 2024-05-10 DIAGNOSIS — R2681 Unsteadiness on feet: Secondary | ICD-10-CM | POA: Diagnosis not present

## 2024-05-10 DIAGNOSIS — R278 Other lack of coordination: Secondary | ICD-10-CM | POA: Diagnosis not present

## 2024-05-11 DIAGNOSIS — R269 Unspecified abnormalities of gait and mobility: Secondary | ICD-10-CM | POA: Diagnosis not present

## 2024-05-11 DIAGNOSIS — E785 Hyperlipidemia, unspecified: Secondary | ICD-10-CM | POA: Diagnosis not present

## 2024-05-11 DIAGNOSIS — G250 Essential tremor: Secondary | ICD-10-CM | POA: Diagnosis not present

## 2024-05-11 DIAGNOSIS — I129 Hypertensive chronic kidney disease with stage 1 through stage 4 chronic kidney disease, or unspecified chronic kidney disease: Secondary | ICD-10-CM | POA: Diagnosis not present

## 2024-05-11 DIAGNOSIS — M25562 Pain in left knee: Secondary | ICD-10-CM | POA: Diagnosis not present

## 2024-05-11 DIAGNOSIS — M6281 Muscle weakness (generalized): Secondary | ICD-10-CM | POA: Diagnosis not present

## 2024-05-11 DIAGNOSIS — K7581 Nonalcoholic steatohepatitis (NASH): Secondary | ICD-10-CM | POA: Diagnosis not present

## 2024-05-11 DIAGNOSIS — R2681 Unsteadiness on feet: Secondary | ICD-10-CM | POA: Diagnosis not present

## 2024-05-11 DIAGNOSIS — R278 Other lack of coordination: Secondary | ICD-10-CM | POA: Diagnosis not present

## 2024-05-11 DIAGNOSIS — W010XXA Fall on same level from slipping, tripping and stumbling without subsequent striking against object, initial encounter: Secondary | ICD-10-CM | POA: Diagnosis not present

## 2024-05-15 DIAGNOSIS — R278 Other lack of coordination: Secondary | ICD-10-CM | POA: Diagnosis not present

## 2024-05-15 DIAGNOSIS — M6281 Muscle weakness (generalized): Secondary | ICD-10-CM | POA: Diagnosis not present

## 2024-05-15 DIAGNOSIS — R2681 Unsteadiness on feet: Secondary | ICD-10-CM | POA: Diagnosis not present

## 2024-05-15 DIAGNOSIS — M25562 Pain in left knee: Secondary | ICD-10-CM | POA: Diagnosis not present

## 2024-05-17 DIAGNOSIS — M25562 Pain in left knee: Secondary | ICD-10-CM | POA: Diagnosis not present

## 2024-05-17 DIAGNOSIS — M6281 Muscle weakness (generalized): Secondary | ICD-10-CM | POA: Diagnosis not present

## 2024-05-17 DIAGNOSIS — R278 Other lack of coordination: Secondary | ICD-10-CM | POA: Diagnosis not present

## 2024-05-17 DIAGNOSIS — R2681 Unsteadiness on feet: Secondary | ICD-10-CM | POA: Diagnosis not present

## 2024-05-31 DIAGNOSIS — G4700 Insomnia, unspecified: Secondary | ICD-10-CM | POA: Diagnosis not present

## 2024-05-31 DIAGNOSIS — I129 Hypertensive chronic kidney disease with stage 1 through stage 4 chronic kidney disease, or unspecified chronic kidney disease: Secondary | ICD-10-CM | POA: Diagnosis not present

## 2024-05-31 DIAGNOSIS — M1990 Unspecified osteoarthritis, unspecified site: Secondary | ICD-10-CM | POA: Diagnosis not present

## 2024-05-31 DIAGNOSIS — K259 Gastric ulcer, unspecified as acute or chronic, without hemorrhage or perforation: Secondary | ICD-10-CM | POA: Diagnosis not present

## 2024-05-31 DIAGNOSIS — R112 Nausea with vomiting, unspecified: Secondary | ICD-10-CM | POA: Diagnosis not present

## 2024-06-12 DIAGNOSIS — R197 Diarrhea, unspecified: Secondary | ICD-10-CM | POA: Diagnosis not present

## 2024-06-12 DIAGNOSIS — G250 Essential tremor: Secondary | ICD-10-CM | POA: Diagnosis not present

## 2024-06-12 DIAGNOSIS — K219 Gastro-esophageal reflux disease without esophagitis: Secondary | ICD-10-CM | POA: Diagnosis not present

## 2024-06-15 DIAGNOSIS — H52223 Regular astigmatism, bilateral: Secondary | ICD-10-CM | POA: Diagnosis not present

## 2024-06-15 DIAGNOSIS — H43813 Vitreous degeneration, bilateral: Secondary | ICD-10-CM | POA: Diagnosis not present

## 2024-06-15 DIAGNOSIS — H35371 Puckering of macula, right eye: Secondary | ICD-10-CM | POA: Diagnosis not present

## 2024-06-15 DIAGNOSIS — H524 Presbyopia: Secondary | ICD-10-CM | POA: Diagnosis not present

## 2024-06-15 DIAGNOSIS — H40053 Ocular hypertension, bilateral: Secondary | ICD-10-CM | POA: Diagnosis not present

## 2024-06-15 DIAGNOSIS — Z961 Presence of intraocular lens: Secondary | ICD-10-CM | POA: Diagnosis not present

## 2024-06-21 DIAGNOSIS — K7460 Unspecified cirrhosis of liver: Secondary | ICD-10-CM | POA: Diagnosis not present

## 2024-06-21 DIAGNOSIS — H8109 Meniere's disease, unspecified ear: Secondary | ICD-10-CM | POA: Diagnosis not present

## 2024-06-21 DIAGNOSIS — R54 Age-related physical debility: Secondary | ICD-10-CM | POA: Diagnosis not present

## 2024-06-21 DIAGNOSIS — I700 Atherosclerosis of aorta: Secondary | ICD-10-CM | POA: Diagnosis not present

## 2024-06-21 DIAGNOSIS — K219 Gastro-esophageal reflux disease without esophagitis: Secondary | ICD-10-CM | POA: Diagnosis not present

## 2024-06-21 DIAGNOSIS — G250 Essential tremor: Secondary | ICD-10-CM | POA: Diagnosis not present

## 2024-06-21 DIAGNOSIS — E785 Hyperlipidemia, unspecified: Secondary | ICD-10-CM | POA: Diagnosis not present

## 2024-06-21 DIAGNOSIS — E1122 Type 2 diabetes mellitus with diabetic chronic kidney disease: Secondary | ICD-10-CM | POA: Diagnosis not present

## 2024-07-06 DIAGNOSIS — E785 Hyperlipidemia, unspecified: Secondary | ICD-10-CM | POA: Diagnosis not present

## 2024-07-17 DIAGNOSIS — S9032XA Contusion of left foot, initial encounter: Secondary | ICD-10-CM | POA: Diagnosis not present

## 2024-07-25 DIAGNOSIS — R110 Nausea: Secondary | ICD-10-CM | POA: Diagnosis not present

## 2024-07-25 DIAGNOSIS — K2270 Barrett's esophagus without dysplasia: Secondary | ICD-10-CM | POA: Diagnosis not present

## 2024-07-25 DIAGNOSIS — R1319 Other dysphagia: Secondary | ICD-10-CM | POA: Diagnosis not present

## 2024-07-25 DIAGNOSIS — K449 Diaphragmatic hernia without obstruction or gangrene: Secondary | ICD-10-CM | POA: Diagnosis not present

## 2024-08-01 DIAGNOSIS — R932 Abnormal findings on diagnostic imaging of liver and biliary tract: Secondary | ICD-10-CM | POA: Diagnosis not present

## 2024-08-01 DIAGNOSIS — K2270 Barrett's esophagus without dysplasia: Secondary | ICD-10-CM | POA: Diagnosis not present

## 2024-08-01 DIAGNOSIS — R1319 Other dysphagia: Secondary | ICD-10-CM | POA: Diagnosis not present

## 2024-08-01 DIAGNOSIS — K449 Diaphragmatic hernia without obstruction or gangrene: Secondary | ICD-10-CM | POA: Diagnosis not present

## 2024-08-01 DIAGNOSIS — K7689 Other specified diseases of liver: Secondary | ICD-10-CM | POA: Diagnosis not present

## 2024-08-01 DIAGNOSIS — R110 Nausea: Secondary | ICD-10-CM | POA: Diagnosis not present

## 2024-08-01 DIAGNOSIS — N281 Cyst of kidney, acquired: Secondary | ICD-10-CM | POA: Diagnosis not present

## 2024-08-09 DIAGNOSIS — K7460 Unspecified cirrhosis of liver: Secondary | ICD-10-CM | POA: Diagnosis not present

## 2024-08-09 DIAGNOSIS — Z23 Encounter for immunization: Secondary | ICD-10-CM | POA: Diagnosis not present

## 2024-08-17 DIAGNOSIS — N3000 Acute cystitis without hematuria: Secondary | ICD-10-CM | POA: Diagnosis not present

## 2024-08-17 DIAGNOSIS — R350 Frequency of micturition: Secondary | ICD-10-CM | POA: Diagnosis not present
